# Patient Record
Sex: Male | Born: 1970 | Race: White | Hispanic: No | Marital: Married | Smoking: Former smoker
Health system: Southern US, Community
[De-identification: ages and names within clinical notes are randomized; demographics above are authoritative.]

---

## 2006-05-15 HISTORY — PX: OTHER SURGICAL HISTORY: SHX169

## 2012-08-14 ENCOUNTER — Ambulatory Visit (INDEPENDENT_AMBULATORY_CARE_PROVIDER_SITE_OTHER): Payer: BC Managed Care – PPO | Admitting: Adult Health

## 2012-08-14 ENCOUNTER — Encounter: Payer: Self-pay | Admitting: Adult Health

## 2012-08-14 VITALS — BP 131/75 | HR 75 | Temp 98.2°F | Resp 14 | Ht 73.0 in | Wt 175.0 lb

## 2012-08-14 DIAGNOSIS — Z125 Encounter for screening for malignant neoplasm of prostate: Secondary | ICD-10-CM | POA: Insufficient documentation

## 2012-08-14 DIAGNOSIS — Z1212 Encounter for screening for malignant neoplasm of rectum: Secondary | ICD-10-CM

## 2012-08-14 DIAGNOSIS — Z0000 Encounter for general adult medical examination without abnormal findings: Secondary | ICD-10-CM | POA: Insufficient documentation

## 2012-08-14 NOTE — Progress Notes (Signed)
Subjective:    Patient ID: Ronald [name], male    DOB: 08/09/1970, 42 y.o.   MRN: [id number]  HPI  Patient is a 42 y/o male who presents to clinic to establish care. He is in good health. He is originally from [place] and moved to [place] in 2009 with his wife.    Past Surgical History  Procedure Laterality Date  . Torn eye muscle Left 2008    Family Hx: Reviewed and noncontributory   History   Social History  . Marital Status: Married    Spouse Name: N/A    Number of Children: N/A  . Years of Education: 12   Occupational History  . Manager    Social History Main Topics  . Smoking status: Former Smoker -- 0.50 packs/day for 10 years    Types: Cigarettes    Quit date: 06/17/2011  . Smokeless tobacco: Never Used  . Alcohol Use: No  . Drug Use: No  . Sexually Active: Yes   Other Topics Concern  . Not on file   Social History Narrative  . No narrative on file     Health Maintenance:  Tdap - 2009  Flu shot - Does not get  Colonoscopy - N/A  Labs - Needs - cbc, lipids, cmet, stool cards sent with patient.  Depression Screen -   Tobacco Use - Remote hx. 10 year 1/2 ppd. Quit 2013  Dental Exams - Will start every 6 months.   Vision Exam - Every 2 years  Exercise - Elliptical, cardio 2 times per week. He works out at [employer] in [place]  Diet - [employer], gluten free mainly because his wife has celiac dz    Review of Systems  Constitutional: Negative.   HENT: Negative.   Eyes: Negative.   Respiratory: Negative.   Cardiovascular: Negative.   Gastrointestinal: Negative.   Endocrine: Negative.   Genitourinary: Negative.   Musculoskeletal: Negative.   Skin: Negative.   Allergic/Immunologic: Negative for food allergies.       Occasional seasonal allergies - mainly in the spring. Does not need any medication for this  Neurological: Negative.   Hematological: Negative.   Psychiatric/Behavioral: Negative.    BP 131/75  Pulse 75   Temp(Src) 98.2 F (36.8 C) (Oral)  Resp 14  Ht 6\' 1"  (1.854 m)  Wt 175 lb (79.379 kg)  BMI 23.09 kg/m2  SpO2 100%    Objective:   Physical Exam  Constitutional: He is oriented to person, place, and time. He appears well-developed and well-nourished. No distress.  HENT:  Head: Normocephalic and atraumatic.  Right Ear: External ear normal.  Left Ear: External ear normal.  Nose: Nose normal.  Mouth/Throat: Oropharynx is clear and moist.  Eyes: Conjunctivae are normal. Pupils are equal, round, and reactive to light. No scleral icterus.  Patient has hx of left eye muscle tear s/p surgical repair. His vision is altered with outer movements (sees double) with the left eye.  Neck: Normal range of motion. Neck supple. No tracheal deviation present. No thyromegaly present.  Cardiovascular: Normal rate, regular rhythm, normal heart sounds and intact distal pulses.  Exam reveals no gallop.   No murmur heard. Pulmonary/Chest: Effort normal and breath sounds normal. No respiratory distress. He has no wheezes. He has no rales.  Abdominal: Soft. Bowel sounds are normal. He exhibits no distension and no mass. There is no tenderness. There is no rebound and no guarding.  Musculoskeletal: Normal range of motion. He exhibits no edema and no  tenderness.  Lymphadenopathy:    He has no cervical adenopathy.  Neurological: He is alert and oriented to person, place, and time. He has normal strength and normal reflexes. He displays no atrophy and no tremor. No cranial nerve deficit or sensory deficit. He exhibits normal muscle tone. He displays a negative Romberg sign. Coordination and gait normal.  Skin: Skin is warm and dry. No rash noted. No erythema. No pallor.  Psychiatric: He has a normal mood and affect. His behavior is normal. Judgment and thought content normal.          Assessment & Plan:

## 2012-08-14 NOTE — Patient Instructions (Addendum)
   Thank you for choosing [place] [facility] for your health care needs.  Please return for your fasting labs.  The results will be available through MyChart for your convenience. Please remember to activate this. The activation code is located at the end of this form.

## 2012-08-14 NOTE — Assessment & Plan Note (Signed)
Normal physical exam. Labs ordered: cbc w/diff, cmet, lipid profile. Hemocult cards x 2 provided to patient. He will mail these cards back to the office. Patient will return for fasting labs.

## 2012-08-15 ENCOUNTER — Other Ambulatory Visit (INDEPENDENT_AMBULATORY_CARE_PROVIDER_SITE_OTHER): Payer: BC Managed Care – PPO

## 2012-08-15 DIAGNOSIS — Z0000 Encounter for general adult medical examination without abnormal findings: Secondary | ICD-10-CM

## 2012-08-15 LAB — CBC WITH DIFFERENTIAL/PLATELET
Basophils Absolute: 0.1 10*3/uL (ref 0.0–0.1)
Eosinophils Absolute: 0.3 10*3/uL (ref 0.0–0.7)
HCT: 44.6 % (ref 39.0–52.0)
Lymphs Abs: 1.5 10*3/uL (ref 0.7–4.0)
MCV: 87.8 fl (ref 78.0–100.0)
Monocytes Absolute: 0.5 10*3/uL (ref 0.1–1.0)
Platelets: 267 10*3/uL (ref 150.0–400.0)
RDW: 13 % (ref 11.5–14.6)

## 2012-08-15 LAB — COMPREHENSIVE METABOLIC PANEL
ALT: 17 U/L (ref 0–53)
Alkaline Phosphatase: 90 U/L (ref 39–117)
Sodium: 136 mEq/L (ref 135–145)
Total Bilirubin: 0.7 mg/dL (ref 0.3–1.2)
Total Protein: 7 g/dL (ref 6.0–8.3)

## 2012-08-15 LAB — LIPID PANEL
Cholesterol: 194 mg/dL (ref 0–200)
LDL Cholesterol: 123 mg/dL — ABNORMAL HIGH (ref 0–99)
VLDL: 12.6 mg/dL (ref 0.0–40.0)

## 2012-08-15 LAB — LDL CHOLESTEROL, DIRECT: Direct LDL: 131 mg/dL

## 2012-08-29 LAB — HEMOCCULT GUIAC POC 1CARD (OFFICE): Fecal Occult Blood, POC: NEGATIVE

## 2012-08-29 NOTE — Addendum Note (Signed)
Addended by: Baldomero Lamy on: 08/29/2012 01:35 PM   Modules accepted: Orders

## 2013-03-20 ENCOUNTER — Other Ambulatory Visit: Payer: Self-pay

## 2015-05-12 ENCOUNTER — Encounter: Payer: Self-pay | Admitting: Nurse Practitioner

## 2015-05-12 ENCOUNTER — Ambulatory Visit (INDEPENDENT_AMBULATORY_CARE_PROVIDER_SITE_OTHER): Payer: 59 | Admitting: Nurse Practitioner

## 2015-05-12 VITALS — BP 128/72 | HR 86 | Temp 98.0°F | Resp 14 | Ht 73.0 in | Wt 180.1 lb

## 2015-05-12 DIAGNOSIS — H6983 Other specified disorders of Eustachian tube, bilateral: Secondary | ICD-10-CM | POA: Diagnosis not present

## 2015-05-12 DIAGNOSIS — H6980 Other specified disorders of Eustachian tube, unspecified ear: Secondary | ICD-10-CM | POA: Insufficient documentation

## 2015-05-12 MED ORDER — PREDNISONE 10 MG PO TABS: ORAL_TABLET | ORAL | Status: DC

## 2015-05-12 NOTE — Patient Instructions (Signed)
Prednisone with breakfast or lunch at the latest.  6 tablets on day 1, 5 tablets on day 2, 4 tablets on day 3, 3 tablets on day 4, 2 tablets day 5, 1 tablet on day 6...done! Take tablets all together not spaced out Don't take with NSAIDs (Ibuprofen, Aleve, Naproxen, Meloxicam ect...)  Call us or MyChart us if not helpful after 6 days.

## 2015-05-12 NOTE — Progress Notes (Signed)
Pre visit review using our clinic review tool, if applicable. No additional management support is needed unless otherwise documented below in the visit note. 

## 2015-05-12 NOTE — Assessment & Plan Note (Signed)
Pt already had a z-pack and no symptoms of infection. Prednisone taper was sent to the pharmacy. FU prn worsening/failure to improve.

## 2015-05-12 NOTE — Progress Notes (Signed)
Patient ID: Ronald [name], male    DOB: Feb 13, 1971  Age: 44 y.o. MRN: [id number]  CC: Ear Pain and Cough   HPI Ronald [name] presents for CC of ear pain x 1.5 weeks.   1) Ear pain and fluid feeling 1.5 weeks  Was on z-pack recently for cough/bronchitis symptoms seen by Urgent care.  Warm water in ears because he felt they needed to be flushed out.  Feels his cough has improved, but is still there x 1 month. R> L ear pain  Treatment to date: Z-pack  Tylenol    History Ronald [name] has no past medical history on file.   He has past surgical history that includes Torn eye muscle (Left, 2008).   His Family history is unknown by patient.He reports that he quit smoking about 3 years ago. His smoking use included Cigarettes. He has a 5 pack-year smoking history. He has never used smokeless tobacco. He reports that he does not drink alcohol or use illicit drugs.  No outpatient prescriptions prior to visit.   No facility-administered medications prior to visit.    ROS Review of Systems  Constitutional: Negative for fever, chills, diaphoresis and fatigue.  HENT: Positive for congestion and ear pain. Negative for ear discharge, postnasal drip, rhinorrhea, sinus pressure, sneezing and sore throat.   Respiratory: Positive for cough. Negative for chest tightness, shortness of breath and wheezing.   Cardiovascular: Negative for chest pain, palpitations and leg swelling.  Gastrointestinal: Negative for nausea, vomiting and diarrhea.  Skin: Negative for rash.  Neurological: Negative for dizziness and headaches.  Psychiatric/Behavioral: The patient is not nervous/anxious.     Objective:  BP 128/72 mmHg  Pulse 86  Temp(Src) 98 F (36.7 C) (Oral)  Resp 14  Ht  (1.854 m)  Wt 180 lb 1.9 oz (81.702 kg)  BMI 23.77 kg/m2  SpO2 98%  Physical Exam  Constitutional: He is oriented to person, place, and time. He appears well-developed and well-nourished. No distress.   HENT:  Head: Normocephalic and atraumatic.  Right Ear: External ear normal.  Left Ear: External ear normal.  Mouth/Throat: No oropharyngeal exudate.  TM's serous fluid, landmarks visible, no injection retraction or bulging  Eyes: EOM are normal. Pupils are equal, round, and reactive to light. Right eye exhibits no discharge. Left eye exhibits no discharge. No scleral icterus.  Cardiovascular: Normal rate, regular rhythm and normal heart sounds.  Exam reveals no gallop and no friction rub.   No murmur heard. Pulmonary/Chest: Effort normal and breath sounds normal. No respiratory distress. He has no wheezes. He has no rales. He exhibits no tenderness.  Neurological: He is alert and oriented to person, place, and time.  Skin: Skin is warm and dry. No rash noted. He is not diaphoretic.  Psychiatric: He has a normal mood and affect. His behavior is normal. Judgment and thought content normal.   Assessment & Plan:   Vonnie was seen today for ear pain and cough.  Diagnoses and all orders for this visit:  ETD (eustachian tube dysfunction), bilateral  Other orders -     predniSONE (DELTASONE) 10 MG tablet; Take 6 tablets by mouth on day 1 with breakfast then decrease by 1 tablet each day until gone.   I am having [name] start on predniSONE.  Meds ordered this encounter  Medications  . predniSONE (DELTASONE) 10 MG tablet    Sig: Take 6 tablets by mouth on day 1 with breakfast then decrease by 1 tablet each day until gone.  Dispense:  21 tablet    Refill:  0    Order Specific Question:  Supervising Provider    Answer:  Sherlene ShamsULLO, TERESA L [[id number]]     Follow-up: Return if symptoms worsen or fail to improve.

## 2018-04-29 ENCOUNTER — Ambulatory Visit (INDEPENDENT_AMBULATORY_CARE_PROVIDER_SITE_OTHER): Payer: 59

## 2018-04-29 ENCOUNTER — Encounter: Payer: Self-pay | Admitting: Family Medicine

## 2018-04-29 ENCOUNTER — Ambulatory Visit: Payer: 59 | Admitting: Family Medicine

## 2018-04-29 VITALS — BP 120/82 | HR 84 | Temp 98.0°F | Ht 74.0 in | Wt 176.0 lb

## 2018-04-29 DIAGNOSIS — M25522 Pain in left elbow: Secondary | ICD-10-CM | POA: Diagnosis not present

## 2018-04-29 MED ORDER — MELOXICAM 7.5 MG PO TABS: 7.5000 mg | ORAL_TABLET | Freq: Every day | ORAL | 0 refills | Status: DC

## 2018-04-29 NOTE — Progress Notes (Signed)
Subjective:    Patient ID: Ronald [name], male    DOB: 1971/02/26, 47 y.o.   MRN: [id number]  HPI  Presents to clinic c/o left elbow pain for 3 weeks.  Last seen here By Naomie Dean NP in 05/12/2015.  Patient denies any known injury to left elbow.  States he does not regularly play any sort of sports, his job is not a physical job.  Denies any falls.  States he notices the pain mostly when he has to do grip something and lift.  Denies any numbness or tingling in extremity.  Denies any swelling of extremity or changes in skin color.  He has been taking 200 to 400 mg of ibuprofen for day without much help and pain relief and also wearing an elbow brace, states wearing the brace has been the most helpful to improve pain.  Patient Active Problem List   Diagnosis Date Noted  . ETD (eustachian tube dysfunction) 05/12/2015  . General medical exam 08/14/2012   Social History   Tobacco Use  . Smoking status: Former Smoker    Packs/day: 0.50    Years: 10.00    Pack years: 5.00    Types: Cigarettes    Last attempt to quit: 06/17/2011    Years since quitting: 6.8  . Smokeless tobacco: Never Used  Substance Use Topics  . Alcohol use: No   Review of Systems   Constitutional: Negative for chills, fatigue and fever.  HENT: Negative for congestion, ear pain, sinus pain and sore throat.   Eyes: Negative.   Respiratory: Negative for cough, shortness of breath and wheezing.   Cardiovascular: Negative for chest pain, palpitations and leg swelling.  Gastrointestinal: Negative for abdominal pain, diarrhea, nausea and vomiting.  Genitourinary: Negative for dysuria, frequency and urgency.  Musculoskeletal: +left elbow pain Skin: Negative for color change, pallor and rash.  Neurological: Negative for syncope, light-headedness and headaches.  Psychiatric/Behavioral: The patient is not nervous/anxious.       Objective:   Physical Exam Vitals signs and nursing note reviewed.    Constitutional:      General: He is not in acute distress.    Appearance: He is not toxic-appearing.  HENT:     Head: Normocephalic and atraumatic.  Eyes:     General: No scleral icterus.    Extraocular Movements: Extraocular movements intact.     Conjunctiva/sclera: Conjunctivae normal.  Cardiovascular:     Rate and Rhythm: Normal rate and regular rhythm.  Pulmonary:     Effort: Pulmonary effort is normal. No respiratory distress.     Breath sounds: Normal breath sounds. No wheezing or rhonchi.  Musculoskeletal:        General: No swelling.     Left elbow: He exhibits normal range of motion, no swelling, no effusion, no deformity and no laceration. Tenderness found.       Arms:     Comments: Patient has 2 areas of tenderness on left elbow indicated by red marks on diagram, it is above and below elbow joint.  Patient is able to fully bend and extend arm at elbow.  He is able to perform pronation and supination motion without issues, but does state the twisting motion does cause him to have some pain.  Grips equal and strong, states a gripping motion with left hand does cause pain in left elbow.  Range of motion of left shoulder and left wrist/fingers on left hand all intact.  Skin:    General: Skin is warm and dry.  Coloration: Skin is not jaundiced or pale.     Findings: No erythema or rash.  Neurological:     Mental Status: He is alert and oriented to person, place, and time.     Sensory: No sensory deficit.     Motor: No weakness.     Coordination: Coordination normal.     Gait: Gait normal.  Psychiatric:        Mood and Affect: Mood normal.        Behavior: Behavior normal.    Vitals:   04/29/18 1534  BP: 120/82  Pulse: 84  Temp: 98 F (36.7 C)  SpO2: 97%      Assessment & Plan:   Left elbow pain - unclear reason for her left elbow pain.  Exam of joint is unremarkable.  We will get x-ray to further evaluate and patient also will begin taking Mobic once per day.   Patient also advised to continue wearing the elbow brace as he has been, wearing the brace has been helpful to improve some pain.  Once we have x-ray results, we will better be able to determine next step in plan of care.  Options could be referral to sports medicine and/or orthopedics.  Patient advised to make a complete physical exam appointment as soon as he is available to get all of his health screening maintenances for his age up-to-date

## 2018-10-27 ENCOUNTER — Other Ambulatory Visit: Payer: Self-pay

## 2018-10-27 ENCOUNTER — Encounter (HOSPITAL_COMMUNITY): Payer: Self-pay | Admitting: Emergency Medicine

## 2018-10-27 ENCOUNTER — Emergency Department (HOSPITAL_COMMUNITY)
Admission: EM | Admit: 2018-10-27 | Discharge: 2018-10-27 | Disposition: A | Discharge status: Home / Self Care | Payer: 59 | Attending: Emergency Medicine | Admitting: Emergency Medicine

## 2018-10-27 DIAGNOSIS — R1013 Epigastric pain: Secondary | ICD-10-CM | POA: Diagnosis not present

## 2018-10-27 DIAGNOSIS — R030 Elevated blood-pressure reading, without diagnosis of hypertension: Secondary | ICD-10-CM | POA: Diagnosis not present

## 2018-10-27 DIAGNOSIS — Z79899 Other long term (current) drug therapy: Secondary | ICD-10-CM | POA: Diagnosis not present

## 2018-10-27 DIAGNOSIS — R42 Dizziness and giddiness: Secondary | ICD-10-CM | POA: Diagnosis present

## 2018-10-27 LAB — URINALYSIS, ROUTINE W REFLEX MICROSCOPIC
Bilirubin Urine: NEGATIVE
Glucose, UA: NEGATIVE mg/dL
Hgb urine dipstick: NEGATIVE
Ketones, ur: NEGATIVE mg/dL
Leukocytes,Ua: NEGATIVE
Nitrite: NEGATIVE
Protein, ur: NEGATIVE mg/dL
Specific Gravity, Urine: 1.004 — ABNORMAL LOW (ref 1.005–1.030)
pH: 6 (ref 5.0–8.0)

## 2018-10-27 LAB — COMPREHENSIVE METABOLIC PANEL
ALT: 13 U/L (ref 0–44)
AST: 12 U/L — ABNORMAL LOW (ref 15–41)
Albumin: 3.7 g/dL (ref 3.5–5.0)
Alkaline Phosphatase: 75 U/L (ref 38–126)
Anion gap: 7 (ref 5–15)
BUN: 14 mg/dL (ref 6–20)
CO2: 26 mmol/L (ref 22–32)
Calcium: 9 mg/dL (ref 8.9–10.3)
Chloride: 107 mmol/L (ref 98–111)
Creatinine, Ser: 1.03 mg/dL (ref 0.61–1.24)
GFR calc Af Amer: >60 mL/min (ref >60)
GFR calc non Af Amer: >60 mL/min (ref >60)
Glucose, Bld: 107 mg/dL — ABNORMAL HIGH (ref 70–99)
Potassium: 4.6 mmol/L (ref 3.5–5.1)
Sodium: 140 mmol/L (ref 135–145)
Total Bilirubin: 0.8 mg/dL (ref 0.3–1.2)
Total Protein: 6.1 g/dL — ABNORMAL LOW (ref 6.5–8.1)

## 2018-10-27 LAB — CBC
HCT: 41.4 % (ref 39.0–52.0)
Hemoglobin: 13.8 g/dL (ref 13.0–17.0)
MCH: 30.1 pg (ref 26.0–34.0)
MCHC: 33.3 g/dL (ref 30.0–36.0)
MCV: 90.4 fL (ref 80.0–100.0)
Platelets: 271 10*3/uL (ref 150–400)
RBC: 4.58 MIL/uL (ref 4.22–5.81)
RDW: 12.4 % (ref 11.5–15.5)
WBC: 5.6 10*3/uL (ref 4.0–10.5)
nRBC: 0 % (ref 0.0–0.2)

## 2018-10-27 LAB — LIPASE, BLOOD: Lipase: 91 U/L — ABNORMAL HIGH (ref 11–51)

## 2018-10-27 LAB — TROPONIN I: Troponin I: <0.03 ng/mL (ref <0.03)

## 2018-10-27 MED ORDER — SODIUM CHLORIDE 0.9% FLUSH: 3.0000 mL | Freq: Once | INTRAVENOUS | Status: DC

## 2018-10-27 MED ORDER — SUCRALFATE 1 G PO TABS: 1.0000 g | ORAL_TABLET | Freq: Three times a day (TID) | ORAL | 0 refills | Status: DC

## 2018-10-27 MED ORDER — PANTOPRAZOLE SODIUM 20 MG PO TBEC: 20.0000 mg | DELAYED_RELEASE_TABLET | Freq: Every day | ORAL | 0 refills | Status: DC

## 2018-10-27 NOTE — ED Triage Notes (Signed)
Pt reports epigastric pain x 2 weeks with nausea, dizziness improved with meclazine, slight headache.  Pt denies SOB, CP, cough, fevers, chills.

## 2018-10-27 NOTE — Discharge Instructions (Signed)
Please see the information and instructions below regarding your visit.  Your diagnoses today include:  1. Epigastric pain   2. Elevated blood pressure reading     Tests performed today include: See side panel of your discharge paperwork for testing performed today. Vital signs are listed at the bottom of these instructions.   Your work-up is very reassuring today.  Your lipase which is a pancreas number is slightly elevated today, which could indicate either prolonged period without eating or a bile duct stone that has since passed.  Medications prescribed:    Take any prescribed medications only as prescribed, and any over the counter medications only as directed on the packaging.  I recommend starting Protonix daily in the morning before breakfast.  You may also take Carafate which is medication that coats the lining of the stomach.  It can be taken 4 times a day, before each meal before bedtime.  Please start taking a medicine called Carafate or sucralfate.  This can be taken up to 4 times a day, with meals and before bedtime.  Please do not take your proton pump inhibitor (Protonix, Nexium, Prilosec) within 30 minutes of taking Carafate.  Please STOP medications such as ibuprofen/Advil/Motrin, naproxen/Aleve, meloxicam.  Home care instructions:  Please follow any educational materials contained in this packet.   Follow-up instructions: Please follow-up with [facility]. I placed a referral.   Return instructions:  Please return to the Emergency Department if you experience worsening symptoms.  Please return to the emergency department he develop any worsening pain, nausea or vomiting) keep anything down, chest pain, shortness of breath or new symptoms. Please return if you have any other emergent concerns.  Additional Information:   Your vital signs today were: BP (!) 137/91    Pulse 69    Temp 98.3 F (36.8 C) (Oral)    Resp 16    Ht 6\' 2"  (1.88 m)    Wt 78.5 kg    SpO2  100%    BMI 22.21 kg/m  If your blood pressure (BP) was elevated on multiple readings during this visit above 130 for the top number or above 80 for the bottom number, please have this repeated by your primary care provider within one month. --------------  Thank you for allowing [place] to participate in your care today.

## 2018-10-27 NOTE — ED Provider Notes (Signed)
[facility] Provider Note   CSN: [id number] Arrival date & time: 10/27/18  1138     History   Chief Complaint Chief Complaint  Patient presents with  . Abdominal Pain    HPI Evelena AsaChristopher Rieser is a 48 y.o. male.     HPI  Patient is a 48 year old male with no significant past medical history presenting for "dizziness" and epigastric pain.  Patient reports that his symptoms began approximately 2 to 3 weeks ago.  He reports that he will have intermittent episodes of feeling lightheaded that are not associated with any particular activity.  He will also experience episodic epigastric discomfort.  Denies radiation to his back or chest.  He denies any association with food or drink.  He denies any vomiting but does report intermittent nausea.  He denies any hematemesis, melena or change in coloration of his stool.  Denies constipation.  Denies history of abdominal surgeries.  Patient denies daily NSAID use but does report that he uses NSAIDs most weeks.  Denies daily alcohol use.  Denies other illicit drug use.  Patient denies history of peptic ulcer disease.  Patient reports that he does not know much of his medical history.  He denies any personal cardiovascular disease history and does not know anything about family history of cardiovascular disease.  He presented to urgent care last week and prescribed meclizine for the lightheadedness, but has not taken any other remedies.  History reviewed. No pertinent past medical history.  Patient Active Problem List   Diagnosis Date Noted  . ETD (eustachian tube dysfunction) 05/12/2015  . General medical exam 08/14/2012    Past Surgical History:  Procedure Laterality Date  . Torn eye muscle Left 2008        Home Medications    Prior to Admission medications   Medication Sig Start Date End Date Taking? Authorizing Provider  meloxicam (MOBIC) 7.5 MG tablet Take 1 tablet (7.5 mg total) by mouth  daily. 04/29/18   Tracey HarriesGuse, Lauren M, FNP    Family History Family History  Family history unknown: Yes    Social History Social History   Tobacco Use  . Smoking status: Former Smoker    Packs/day: 0.50    Years: 10.00    Pack years: 5.00    Types: Cigarettes    Quit date: 06/17/2011    Years since quitting: 7.3  . Smokeless tobacco: Never Used  Substance Use Topics  . Alcohol use: No  . Drug use: No     Allergies   Patient has no known allergies.   Review of Systems Review of Systems  Constitutional: Negative for chills and fever.  HENT: Negative for congestion, rhinorrhea, sinus pain and sore throat.   Eyes: Negative for visual disturbance.  Respiratory: Negative for cough, chest tightness and shortness of breath.   Cardiovascular: Negative for chest pain, palpitations and leg swelling.  Gastrointestinal: Positive for abdominal pain and nausea. Negative for constipation, diarrhea and vomiting.  Genitourinary: Negative for dysuria and flank pain.  Musculoskeletal: Negative for back pain and myalgias.  Skin: Negative for rash.  Neurological: Positive for light-headedness. Negative for dizziness, syncope and headaches.     Physical Exam Updated Vital Signs BP (!) 137/91   Pulse 85   Temp 98.3 F (36.8 C) (Oral)   Resp 15   Ht 6\' 2"  (1.88 m)   Wt 78.5 kg   SpO2 99%   BMI 22.21 kg/m   Physical Exam Vitals signs and nursing  note reviewed.  Constitutional:      General: He is not in acute distress.    Appearance: He is well-developed.  HENT:     Head: Normocephalic and atraumatic.  Eyes:     Conjunctiva/sclera: Conjunctivae normal.     Pupils: Pupils are equal, round, and reactive to light.  Neck:     Musculoskeletal: Normal range of motion and neck supple.  Cardiovascular:     Rate and Rhythm: Normal rate and regular rhythm.     Heart sounds: S1 normal and S2 normal. No murmur.  Pulmonary:     Effort: Pulmonary effort is normal.     Breath sounds:  Normal breath sounds. No wheezing or rales.  Abdominal:     General: There is no distension.     Palpations: Abdomen is soft.     Tenderness: There is no abdominal tenderness. There is no guarding.  Musculoskeletal: Normal range of motion.        General: No deformity.  Lymphadenopathy:     Cervical: No cervical adenopathy.  Skin:    General: Skin is warm and dry.     Findings: No erythema or rash.  Neurological:     Mental Status: He is alert.     Comments: Cranial nerves grossly intact. Patient moves extremities symmetrically and with good coordination.  Psychiatric:        Behavior: Behavior normal.        Thought Content: Thought content normal.        Judgment: Judgment normal.      ED Treatments / Results  Labs (all labs ordered are listed, but only abnormal results are displayed) Labs Reviewed  LIPASE, BLOOD - Abnormal; Notable for the following components:      Result Value   Lipase 91 (*)    All other components within normal limits  COMPREHENSIVE METABOLIC PANEL - Abnormal; Notable for the following components:   Glucose, Bld 107 (*)    Total Protein 6.1 (*)    AST 12 (*)    All other components within normal limits  URINALYSIS, ROUTINE W REFLEX MICROSCOPIC - Abnormal; Notable for the following components:   Color, Urine COLORLESS (*)    Specific Gravity, Urine 1.004 (*)    All other components within normal limits  CBC  TROPONIN I    EKG    EKG Interpretation  Date/Time:  Sunday October 27 2018 12:18:32 EDT Ventricular Rate:  73 PR Interval:    QRS Duration: 92 QT Interval:  387 QTC Calculation: 427 R Axis:   54 Text Interpretation:  dSinus rhythm Borderline short PR interval No old tracing to compare Confirmed by Jola Schmidt [phone]) on 10/27/2018 2:01:44 PM       Radiology No results found.  Procedures Procedures (including critical care time)  Medications Ordered in ED Medications  sodium chloride flush (NS) 0.9 % injection 3 mL (has no  administration in time range)     Initial Impression / Assessment and Plan / ED Course  I have reviewed the triage vital signs and the nursing notes.  Pertinent labs & imaging results that were available during my care of the patient were reviewed by me and considered in my medical decision making (see chart for details).  Clinical Course as of Oct 26 1401  Sun Oct 27, 2018  1319 Normal hemoglobin.   Hemoglobin: 13.8 [AM]  1333 Lipase(!): 91 [AM]  1333 Troponin I: <0.03 [AM]    Clinical Course User Index [AM] Langston Masker  B, PA-C       This is a well-appearing 48 year old male with no significant past medical history presenting for epigastric pain and intermittent lightheadedness.  Differential diagnosis includes peptic ulcer disease, anemia, ACS, gastritis, esophageal spasm, pancretitis.  He is pain-free here and has a benign abdominal exam.  Patient does not have any classic exertional symptoms suggesting unstable angina.  Work-up is reassuring.  No leukocytosis.  Total protein slightly low, but CMP otherwise unremarkable.  Troponin is negative.  Lipase slightly elevated at 91, but does not meet diagnostic criteria for pancreatitis.  Urinalysis is clear without evidence of infection.  EKG normal sinus rhythm with evidence of acute ischemia, infarction, or arrhythmia, reviewed by me.  Given patient's well appearance, and pain-free status, feel that patient's presentation most representive of gastritis versus peptic ulcer disease.  Will start patient on proton pump inhibitor and Carafate.  [facility] is a [facility] and will refer to [facility].   Final Clinical Impressions(s) / ED Diagnoses   Final diagnoses:  Epigastric pain  Elevated blood pressure reading    ED Discharge Orders         Ordered    Ambulatory referral to Gastroenterology     10/27/18 1407    pantoprazole (PROTONIX) 20 MG tablet  Daily     10/27/18 1409    sucralfate (CARAFATE) 1 g tablet  3 times daily  with meals & bedtime     10/27/18 1409           Delia ChimesMurray, Alyssa B, PA-C 10/27/18 2332    Azalia Bilisampos, Kevin, MD 10/28/18 1348

## 2018-10-28 ENCOUNTER — Encounter: Payer: Self-pay | Admitting: Gastroenterology

## 2018-10-29 ENCOUNTER — Emergency Department (HOSPITAL_COMMUNITY)
Admission: EM | Admit: 2018-10-29 | Discharge: 2018-10-29 | Disposition: A | Discharge status: Home / Self Care | Payer: 59 | Attending: Emergency Medicine | Admitting: Emergency Medicine

## 2018-10-29 ENCOUNTER — Encounter (HOSPITAL_COMMUNITY): Payer: Self-pay | Admitting: Emergency Medicine

## 2018-10-29 ENCOUNTER — Emergency Department (HOSPITAL_COMMUNITY): Payer: 59

## 2018-10-29 ENCOUNTER — Other Ambulatory Visit: Payer: Self-pay

## 2018-10-29 DIAGNOSIS — R911 Solitary pulmonary nodule: Secondary | ICD-10-CM

## 2018-10-29 DIAGNOSIS — R42 Dizziness and giddiness: Secondary | ICD-10-CM | POA: Diagnosis not present

## 2018-10-29 DIAGNOSIS — R5383 Other fatigue: Secondary | ICD-10-CM

## 2018-10-29 LAB — CBC
HCT: 44.1 % (ref 39.0–52.0)
Hemoglobin: 14.5 g/dL (ref 13.0–17.0)
MCH: 30 pg (ref 26.0–34.0)
MCHC: 32.9 g/dL (ref 30.0–36.0)
MCV: 91.1 fL (ref 80.0–100.0)
Platelets: 299 10*3/uL (ref 150–400)
RBC: 4.84 MIL/uL (ref 4.22–5.81)
RDW: 12.2 % (ref 11.5–15.5)
WBC: 10.3 10*3/uL (ref 4.0–10.5)
nRBC: 0 % (ref 0.0–0.2)

## 2018-10-29 LAB — COMPREHENSIVE METABOLIC PANEL
ALT: 14 U/L (ref 0–44)
AST: 14 U/L — ABNORMAL LOW (ref 15–41)
Albumin: 4.1 g/dL (ref 3.5–5.0)
Alkaline Phosphatase: 75 U/L (ref 38–126)
Anion gap: 10 (ref 5–15)
BUN: 14 mg/dL (ref 6–20)
CO2: 25 mmol/L (ref 22–32)
Calcium: 9.3 mg/dL (ref 8.9–10.3)
Chloride: 105 mmol/L (ref 98–111)
Creatinine, Ser: 1.06 mg/dL (ref 0.61–1.24)
GFR calc Af Amer: >60 mL/min (ref >60)
GFR calc non Af Amer: >60 mL/min (ref >60)
Glucose, Bld: 97 mg/dL (ref 70–99)
Potassium: 4.7 mmol/L (ref 3.5–5.1)
Sodium: 140 mmol/L (ref 135–145)
Total Bilirubin: 0.6 mg/dL (ref 0.3–1.2)
Total Protein: 6.5 g/dL (ref 6.5–8.1)

## 2018-10-29 LAB — TSH: TSH: 2.948 u[IU]/mL (ref 0.350–4.500)

## 2018-10-29 LAB — T4, FREE: Free T4: 0.85 ng/dL (ref 0.61–1.12)

## 2018-10-29 LAB — TROPONIN I: Troponin I: <0.03 ng/mL (ref <0.03)

## 2018-10-29 LAB — LIPASE, BLOOD: Lipase: 38 U/L (ref 11–51)

## 2018-10-29 NOTE — ED Provider Notes (Signed)
[name]   CSN: [id number] Arrival date & time: 10/29/18  1402    History   Chief Complaint Chief Complaint  Patient presents with  . Fatigue  . Dizziness    HPI Ronald [name] is a 48 y.o. male.     HPI  Patient presents to the emergency room for evaluation of persistent fatigue and general malaise.  Patient started having symptoms a few weeks ago.  He has been having intermittent episodes of feeling off.  He describes it as a lightheadedness.  He room spinning sensation.  He is not have any trouble with his gait.  He has not had any syncopal episodes.  He does not have any trouble with his speech balance or coordination.  He is also had some intermittent discomfort in his epigastric region.  It will come and go.  He denies any vomiting or diarrhea.  No fevers or chills.  No abdominal pain.  No chest pain.  No headache.  Patient was seen in the emergency room on June 14.  He had a reassuring evaluation and was referred to gastroenterology.  Patient states he had another so today.  He just wanted to make sure he was okay to wait until Thursday so he came in for reevaluation. History reviewed. No pertinent past medical history.  Patient Active Problem List   Diagnosis Date Noted  . ETD (eustachian tube dysfunction) 05/12/2015  . General medical exam 08/14/2012    Past Surgical History:  Procedure Laterality Date  . Torn eye muscle Left 2008        Home Medications    Prior to Admission medications   Medication Sig Start Date End Date Taking? Authorizing Provider  aspirin EC 325 MG tablet Take 650 mg by mouth daily as needed (headache).   Yes [provider]  Omega-3 1000 MG CAPS Take 1,000 mg by mouth daily.   Yes [provider]  pantoprazole (PROTONIX) 20 MG tablet Take 1 tablet (20 mg total) by mouth daily for 30 days. 10/27/18 11/26/18 Yes Dayton ScrapeMurray, Alyssa B, PA-C  sucralfate (CARAFATE) 1 g  tablet Take 1 tablet (1 g total) by mouth 4 (four) times daily -  with meals and at bedtime for 10 days. 10/27/18 11/06/18 Yes Elisha PonderMurray, Alyssa B, PA-C    Family History Family History  Family history unknown: Yes    Social History Social History   Tobacco Use  . Smoking status: Former Smoker    Packs/day: 0.50    Years: 10.00    Pack years: 5.00    Types: Cigarettes    Quit date: 06/17/2011    Years since quitting: 7.3  . Smokeless tobacco: Never Used  Substance Use Topics  . Alcohol use: No  . Drug use: No     Allergies   Patient has no known allergies.   Review of Systems Review of Systems  All other systems reviewed and are negative.    Physical Exam Updated Vital Signs BP (!) 135/96   Pulse 62   Temp 98.8 F (37.1 C) (Oral)   Resp 13   SpO2 100%   Physical Exam Vitals signs and nursing note reviewed.  Constitutional:      General: He is not in acute distress.    Appearance: He is well-developed.  HENT:     Head: Normocephalic and atraumatic.     Right Ear: External ear normal.     Left Ear: External ear normal.  Eyes:  General: No scleral icterus.       Right eye: No discharge.        Left eye: No discharge.     Conjunctiva/sclera: Conjunctivae normal.  Neck:     Musculoskeletal: Neck supple.     Trachea: No tracheal deviation.  Cardiovascular:     Rate and Rhythm: Normal rate and regular rhythm.  Pulmonary:     Effort: Pulmonary effort is normal. No respiratory distress.     Breath sounds: Normal breath sounds. No stridor. No wheezing or rales.  Abdominal:     General: Bowel sounds are normal. There is no distension.     Palpations: Abdomen is soft.     Tenderness: There is no abdominal tenderness. There is no guarding or rebound.  Musculoskeletal:        General: No tenderness.  Skin:    General: Skin is warm and dry.     Findings: No rash.  Neurological:     Mental Status: He is alert.     Cranial Nerves: No cranial nerve deficit (no  facial droop, extraocular movements intact, no slurred speech).     Sensory: No sensory deficit.     Motor: No abnormal muscle tone or seizure activity.     Coordination: Coordination normal.      ED Treatments / Results  Labs (all labs ordered are listed, but only abnormal results are displayed) Labs Reviewed  COMPREHENSIVE METABOLIC PANEL - Abnormal; Notable for the following components:      Result Value   AST 14 (*)    All other components within normal limits  CBC  TROPONIN I  TSH  T4, FREE  LIPASE, BLOOD    EKG EKG Interpretation  Date/Time:  Tuesday October 29 2018 15:59:39 EDT Ventricular Rate:  67 PR Interval:    QRS Duration: 94 QT Interval:  421 QTC Calculation: 445 R Axis:   53 Text Interpretation:  Sinus rhythm Abnormal R-wave progression, early transition No significant change since last tracing Confirmed by Linwood DibblesKnapp, Addisen Chappelle [phone]([id number]) on 10/29/2018 4:07:12 PM   Radiology Dg Chest 2 View  Result Date: 10/29/2018 CLINICAL DATA:  Fatigue and lightheaded for 2 days. EXAM: CHEST - 2 VIEW COMPARISON:  None. FINDINGS: The cardiac silhouette, mediastinal and hilar contours are within normal limits. No infiltrates, edema or effusions. There is a rounded nodular density in the right upper lobe which is an indeterminate finding without prior chest films. Chest CT recommended to exclude a pulmonary nodule given smoking history. The bony thorax is intact. IMPRESSION: 1. No acute cardiopulmonary findings. 2. Suspect right upper lobe pulmonary nodule. Chest CT suggested for further evaluation. Electronically Signed   By: Rudie MeyerP.  Gallerani M.D.   On: 10/29/2018 16:35    Procedures Procedures (including critical care time)  Medications Ordered in ED Medications - No data to display   Initial Impression / Assessment and Plan / ED Course  I have reviewed the triage vital signs and the nursing notes.  Pertinent labs & imaging results that were available during my care of the patient  were reviewed by me and considered in my medical decision making (see chart for details).   Patient's ED work-up is reassuring.  He is not anemic.  No signs of acute cardiac injury.  No evidence of thyroid dysfunction.  He has normal electrolytes and renal function.  The etiology of his symptoms is unclear but I am not see any signs of any emergency medical condition.  Incidental lung nodule was discussed with  the patient.  I recommended he also keep a record of his blood pressure over the next week or 2 and bring that to review with his primary care doctor.  Final Clinical Impressions(s) / ED Diagnoses   Final diagnoses:  Other fatigue  Lung nodule    ED Discharge Orders    None       Dorie Rank, MD 10/29/18 1727

## 2018-10-29 NOTE — ED Triage Notes (Signed)
Pt. Stated, I was here on Sunday with the same problems. Im tired, I feel light headed. I feel like there is a ball stuck in my lower throat but nothing is there. I have an appt with [facility] on Thursday but did not want to wait.

## 2018-10-29 NOTE — Discharge Instructions (Signed)
Follow-up with your appointment as scheduled.  Start keeping a record of your blood pressure.  Take it at least once or twice daily for the next week or 2 to show to your primary care doctor.  The radiologist did recommend a chest CT at some point to follow-up on an incidental lung nodule.

## 2018-10-31 ENCOUNTER — Ambulatory Visit (INDEPENDENT_AMBULATORY_CARE_PROVIDER_SITE_OTHER): Payer: 59 | Admitting: Family Medicine

## 2018-10-31 ENCOUNTER — Encounter: Payer: Self-pay | Admitting: Family Medicine

## 2018-10-31 ENCOUNTER — Other Ambulatory Visit: Payer: Self-pay

## 2018-10-31 VITALS — BP 112/68 | HR 64 | Temp 97.8°F | Resp 18 | Ht 74.0 in | Wt 175.0 lb

## 2018-10-31 DIAGNOSIS — R5383 Other fatigue: Secondary | ICD-10-CM | POA: Diagnosis not present

## 2018-10-31 DIAGNOSIS — R1013 Epigastric pain: Secondary | ICD-10-CM | POA: Diagnosis not present

## 2018-10-31 DIAGNOSIS — Z0000 Encounter for general adult medical examination without abnormal findings: Secondary | ICD-10-CM

## 2018-10-31 DIAGNOSIS — R079 Chest pain, unspecified: Secondary | ICD-10-CM

## 2018-10-31 DIAGNOSIS — F418 Other specified anxiety disorders: Secondary | ICD-10-CM | POA: Diagnosis not present

## 2018-10-31 MED ORDER — HYDROXYZINE HCL 25 MG PO TABS: 25.0000 mg | ORAL_TABLET | Freq: Three times a day (TID) | ORAL | 2 refills | Status: DC | PRN

## 2018-10-31 NOTE — Progress Notes (Signed)
Subjective:    Patient ID: Ronald [name], male    DOB: 08/26/1970, 48 y.o.   MRN: [id number]  HPI   Patient presents to clinic for complete physical exam and ER follow-up.  Patient went to the ER on 6/14 and 10/29/2018 due to upper abdominal pain, lower chest pain and feeling of fatigue.  Lab work, EKGs and chest x-ray performed which ruled out acute coronary syndrome and did not show any obvious signs of fatigue.  Patient diagnosed with a suspected ulcer/GERD and was prescribed Protonix daily, Carafate 4 times daily and has been set up with GI.  States GI appointment is happening on 6/29 and he was told he most likely will get an endoscopy.  Patient is still concerned because he has been taking the Protonix and Carafate now for 2 days, but still has upper abdominal pain and lower chest pain.  Patient is concerned something else could be going on with his heart they did not see.  Prior to going to the emergency room recently, he took no regular medications and would describe himself as a healthy 48 year old male.  Denies feeling down or depressed.  Is currently anxious about his current health situation and worried something could be wrong however prior to going to the emergency room would not describe himself as anxious.  Denies SI or HI.  He tries to follow healthy diet and exercise regularly.  He does see the dentist twice per year and usually sees eye doctor every 2 years.  States he does not know much about his family history but as far as he knows there is no history of any cancers in the family.   Patient Active Problem List   Diagnosis Date Noted  . ETD (eustachian tube dysfunction) 05/12/2015  . General medical exam 08/14/2012   Social History   Tobacco Use  . Smoking status: Former Smoker    Packs/day: 0.50    Years: 10.00    Pack years: 5.00    Types: Cigarettes    Quit date: 06/17/2011    Years since quitting: 7.3  . Smokeless tobacco: Never Used  Substance Use  Topics  . Alcohol use: No   Past Surgical History:  Procedure Laterality Date  . Torn eye muscle Left 2008   Family History  Family history unknown: Yes    Review of Systems  Constitutional: Negative for chills, fatigue and fever.  HENT: Negative for congestion, ear pain, sinus pain and sore throat.   Eyes: Negative.   Respiratory: Negative for cough, shortness of breath and wheezing.   Cardiovascular: +lower chest pain. Negative for palpitations and leg swelling.  Gastrointestinal: +upper ABD pain. Negative for diarrhea, nausea and vomiting.  Genitourinary: Negative for dysuria, frequency and urgency.  Musculoskeletal: Negative for arthralgias and myalgias.  Skin: Negative for color change, pallor and rash.  Neurological: Negative for syncope, light-headedness and headaches.  Psychiatric/Behavioral: The patient is nervous/anxious.       Objective:   Physical Exam Vitals signs and nursing note reviewed.  Constitutional:      General: He is not in acute distress.    Appearance: He is normal weight. He is not ill-appearing, toxic-appearing or diaphoretic.  HENT:     Head: Normocephalic and atraumatic.     Right Ear: Tympanic membrane, ear canal and external ear normal.     Left Ear: Tympanic membrane, ear canal and external ear normal.  Eyes:     General: No scleral icterus.    Extraocular Movements: Extraocular  movements intact.     Conjunctiva/sclera: Conjunctivae normal.     Pupils: Pupils are equal, round, and reactive to light.  Cardiovascular:     Rate and Rhythm: Normal rate and regular rhythm.     Heart sounds: Normal heart sounds. No murmur. No gallop.   Pulmonary:     Effort: Pulmonary effort is normal. No respiratory distress.     Breath sounds: Normal breath sounds. No stridor. No wheezing, rhonchi or rales.  Chest:     Chest wall: Tenderness present.       Comments: Area of chest tenderness indicated by red circle on diagram Abdominal:     General:  Abdomen is flat. Bowel sounds are normal. There is no distension.     Palpations: Abdomen is soft. There is no mass.     Tenderness: There is abdominal tenderness (epigastric tenderness). There is no right CVA tenderness, left CVA tenderness, guarding or rebound.     Hernia: No hernia is present.       Comments: Location of ABD tenderness indicated by blue on diagram  Neurological:     Mental Status: He is alert.    BP Readings from Last 3 Encounters:  10/29/18 (!) 135/96  10/27/18 125/84  04/29/18 120/82   Today's Vitals   10/31/18 1503  BP: 112/68  Pulse: 64  Resp: 18  Temp: 97.8 F (36.6 C)  TempSrc: Oral  SpO2: 98%  Weight: 175 lb (79.4 kg)  Height: 6\' 2"  (1.88 m)   Body mass index is 22.47 kg/m.     Assessment & Plan:   Well adult exam - overall patient appears a healthy 48 year old male.  Lab work done in the emergency department was reviewed by me and shows stable complete blood count, CMP, thyroid level.  He declines getting any more lab work today to check lipid panel or PSA.  Healthy diet and regular fixable activity reviewed and recommended.  Patient always wears seatbelt while in vehicle.  Sees dentist and eye doctor regularly.  Discussed safe sun practices including wearing SPF of at least 30 when outdoors for extended period of time, also recommended wearing a wide brim hat and/or long sleeves of going to be in direct sun for many hours.  Epigastric pain/chest pain- reassured patient that his work-up in ER is unremarkable for any acute cardiac issue.  Also reassured that his physical exam does appear consistent with ulcer/severe acid reflux.  Recommended he continue taking Protonix and Carafate as prescribed and follow-up with GI.  No abdominal imaging was done in the emergency room, so patient is interested in having abdominal ultrasound.  Patient also concerned that there still could be something going on with a heart they did not see on the EKG, and the troponins  or while he was hooked to the heart monitor.  After discussion of options, patient is agreeable to get a echocardiogram.  Fatigue - reviewed lab work with patient and advised that nothing in the labs done in the emergency room reveal any obvious cause of fatigue.  Offered to check more lab work including vitamin D, B12 and iron levels to further investigate fatigue however he declines and will let me know if he changes his mind.  Patient aware someone will contact him to set up echocardiogram and abdominal ultrasound appointment.  He will keep follow-up with GI.  Advised to call office right away if any symptoms change or new symptoms arise.

## 2018-10-31 NOTE — Patient Instructions (Addendum)
BP Readings from Last 3 Encounters:  10/29/18 (!) 135/96  10/27/18 125/84  04/29/18 120/82    Peptic Ulcer  A peptic ulcer is a painful sore in the lining of your stomach or the first part of your small intestine. What are the causes? Common causes of this condition include:  An infection.  Using certain pain medicines too often or too much. What increases the risk? You are more likely to get this condition if you:  Smoke.  Have a family history of ulcer disease.  Drink alcohol.  Have been hospitalized in an intensive care unit (ICU). What are the signs or symptoms? Symptoms include:  Burning pain in the area between the chest and the belly button. The pain may: ? Not go away (be persistent). ? Be worse when your stomach is empty. ? Be worse at night.  Heartburn.  Feeling sick to your stomach (nauseous) and throwing up (vomiting).  Bloating. If the ulcer results in bleeding, it can cause you to:  Have poop (stool) that is black and looks like tar.  Throw up bright red blood.  Throw up material that looks like coffee grounds. How is this treated? Treatment for this condition may include:  Stopping things that can cause the ulcer, such as: ? Smoking. ? Using pain medicines.  Medicines to reduce stomach acid.  Antibiotic medicines if the ulcer is caused by an infection.  A procedure that is done using a small, flexible tube that has a camera at the end (upper endoscopy). This may be done if you have a bleeding ulcer.  Surgery. This may be needed if: ? You have a lot of bleeding. ? The ulcer caused a hole somewhere in the digestive system. Follow these instructions at home:  Do not drink alcohol if your doctor tells you not to drink.  Limit how much caffeine you take in.  Do not use any products that contain nicotine or tobacco, such as cigarettes, e-cigarettes, and chewing tobacco. If you need help quitting, ask your doctor.  Take over-the-counter and  prescription medicines only as told by your doctor. ? Do not stop or change your medicines unless you talk with your doctor about it first. ? Do not take aspirin, ibuprofen, or other NSAIDs unless your doctor told you to do so.  Keep all follow-up visits as told by your doctor. This is important. Contact a doctor if:  You do not get better in 7 days after you start treatment.  You keep having an upset stomach (indigestion) or heartburn. Get help right away if:  You have sudden, sharp pain in your belly (abdomen).  You have belly pain that does not go away.  You have bloody poop (stool) or black, tarry poop.  You throw up blood. It may look like coffee grounds.  You feel light-headed or feel like you may pass out (faint).  You get weak.  You get sweaty or feel sticky and cold to the touch (clammy). Summary  Symptoms of a peptic ulcer include burning pain in the area between the chest and the belly button.  Take medicines only as told by your doctor.  Limit how much alcohol and caffeine you have.  Keep all follow-up visits as told by your doctor. This information is not intended to replace advice given to you by your health care provider. Make sure you discuss any questions you have with your health care provider. Document Released: 07/26/2009 Document Revised: 11/06/2017 Document Reviewed: 11/06/2017 Elsevier Interactive Patient Education  2019 [place].

## 2018-11-08 ENCOUNTER — Ambulatory Visit
Admission: RE | Admit: 2018-11-08 | Discharge: 2018-11-08 | Disposition: A | Discharge status: Home / Self Care | Payer: 59 | Source: Ambulatory Visit | Attending: Family Medicine | Admitting: Family Medicine

## 2018-11-08 ENCOUNTER — Other Ambulatory Visit: Payer: Self-pay

## 2018-11-08 DIAGNOSIS — R1013 Epigastric pain: Secondary | ICD-10-CM | POA: Diagnosis not present

## 2018-11-11 ENCOUNTER — Ambulatory Visit (INDEPENDENT_AMBULATORY_CARE_PROVIDER_SITE_OTHER): Payer: 59 | Admitting: Gastroenterology

## 2018-11-11 ENCOUNTER — Encounter: Payer: Self-pay | Admitting: Gastroenterology

## 2018-11-11 VITALS — Ht 74.0 in | Wt 175.0 lb

## 2018-11-11 DIAGNOSIS — R911 Solitary pulmonary nodule: Secondary | ICD-10-CM | POA: Diagnosis not present

## 2018-11-11 DIAGNOSIS — R1013 Epigastric pain: Secondary | ICD-10-CM

## 2018-11-11 NOTE — Patient Instructions (Addendum)
I recommended and offered a CT scan of his chest to follow-up the possible pulmonary nodule however he declined.  I told him to call back if he changes his mind or to call his primary care physician about it.   We will arrange upper endoscopy at his soonest convenience for epigastric pains.  Thank you for entrusting me with your care and choosing [facility].  Dr Ardis Hughs

## 2018-11-11 NOTE — Progress Notes (Signed)
This service was provided via virtual visit.  Both audio and visual were used. The patient was located at home.  I was located in my office.  The patient did consent to this virtual visit and is aware of possible charges through their insurance for this visit.  The patient is a new patient.  My certified medical assistant, Grace Bushy, contributed to this visit by contacting the patient by phone 1 or 2 business days prior to the appointment and also followed up on the recommendations I made after the visit.  Time spent on virtual visit:25 minutes, referred by Philis Nettle, NP   HPI: This is a very pleasant 48 year old man whom I met for the first time via telemedicine visit today.  About a month ago he started having epigastric pains.  He describes it as a heaviness.  Pushing at the site of the pain in his epigastrium can sometimes relieve the pain.  Pain is intermittent.  Eating does not reliably bring it on.  Exercise does not cause it at all.  When he feels that the pain will last 2 to 3 hours.  He often feels nausea at the same time.  Pain does not radiate anywhere.  He went to the emergency room for this and underwent lab test x-rays.  See those below.  He was given Protonix which he is taking and he thinks it does help the pain somewhat but not completely.  He saw his primary care physician in follow-up after his ER visit and was scheduled to have an echocardiogram in early July for this.  He has had no bowel troubles.  Specifically no constipation, no diarrhea, no overt bleeding.  Labs June 2020 show normal complete metabolic profile, normal TSH, normal CBC, normal lipase  Abdominal ultrasound June 2020 indication "epigastric pain" findings sludge in the gallbladder.  No obvious stones.  Bile duct was normal.  Examination was otherwise normal.  2 view chest x-ray June 2020 "fatigue and lightheaded".  "Suspect right upper lobe pulmonary nodule."  The radiologist recommended a CT scan be done in  follow-up however I do not see that that has been ordered.  He takes ibuprofen for headaches.  Takes this twice per week 2 pills at a time.  Takes tylenol.     Chief complaint is epigastric pain, pulmonary nodule  ROS: complete GI ROS as described in HPI, all other review negative.  Constitutional:  No unintentional weight loss   History reviewed. No pertinent past medical history.  Past Surgical History:  Procedure Laterality Date  . Torn eye muscle Left 2008    Current Outpatient Medications  Medication Sig Dispense Refill  . aspirin EC 325 MG tablet Take 650 mg by mouth daily as needed (headache).    . hydrOXYzine (ATARAX/VISTARIL) 25 MG tablet Take 1 tablet (25 mg total) by mouth 3 (three) times daily as needed. 30 tablet 2  . Omega-3 1000 MG CAPS Take 1,000 mg by mouth daily.    . pantoprazole (PROTONIX) 20 MG tablet Take 1 tablet (20 mg total) by mouth daily for 30 days. 30 tablet 0  . sucralfate (CARAFATE) 1 g tablet Take 1 tablet (1 g total) by mouth 4 (four) times daily -  with meals and at bedtime for 10 days. 40 tablet 0   No current facility-administered medications for this visit.     Allergies as of 11/11/2018  . (No Known Allergies)    Family History  Family history unknown: Yes    Social  History   Socioeconomic History  . Marital status: Married    Spouse name: Not on file  . Number of children: Not on file  . Years of education: 81  . Highest education level: Not on file  Occupational History  . Occupation: Freight forwarder  Social Needs  . Financial resource strain: Not on file  . Food insecurity    Worry: Not on file    Inability: Not on file  . Transportation needs    Medical: Not on file    Non-medical: Not on file  Tobacco Use  . Smoking status: Former Smoker    Packs/day: 0.50    Years: 10.00    Pack years: 5.00    Types: Cigarettes    Quit date: 06/17/2011    Years since quitting: 7.4  . Smokeless tobacco: Never Used  Substance and Sexual  Activity  . Alcohol use: No  . Drug use: No  . Sexual activity: Yes  Lifestyle  . Physical activity    Days per week: Not on file    Minutes per session: Not on file  . Stress: Not on file  Relationships  . Social Herbalist on phone: Not on file    Gets together: Not on file    Attends religious service: Not on file    Active member of club or organization: Not on file    Attends meetings of clubs or organizations: Not on file    Relationship status: Not on file  . Intimate partner violence    Fear of current or ex partner: Not on file    Emotionally abused: Not on file    Physically abused: Not on file    Forced sexual activity: Not on file  Other Topics Concern  . Not on file  Social History Narrative  . Not on file     Physical Exam: Unable to perform because this was a "telemed visit" due to current Covid-19 pandemic  Assessment and plan: 48 y.o. male with epigastric pain, pulmonary nodule  First I recommended and offered to arrange a CT scan of his chest to work-up the pulmonary nodule that was described on his recent chest x-ray.  He declined.  I told him if he changes his mind he can call any time or call his primary care physician and I recommended not overlooking something that could potentially be a growth or cancer.  Second for his epigastric pain this may be biliary, might be gastric.  Abdominal ultrasound suggested possibly some sludge.  Maybe he has biliary dyskinesia.  He does take NSAIDs periodically and I recommended EGD as first test to work this up.  I see no reason for any further blood tests or imaging studies prior to then.  Please see the "Patient Instructions" section for addition details about the plan.  Owens Loffler, MD [facility] 11/11/2018, 3:39 PM

## 2018-11-18 ENCOUNTER — Other Ambulatory Visit: Payer: Self-pay

## 2018-11-18 ENCOUNTER — Ambulatory Visit
Admission: RE | Admit: 2018-11-18 | Discharge: 2018-11-18 | Disposition: A | Discharge status: Home / Self Care | Payer: 59 | Source: Ambulatory Visit | Attending: Family Medicine | Admitting: Family Medicine

## 2018-11-18 DIAGNOSIS — R1013 Epigastric pain: Secondary | ICD-10-CM | POA: Diagnosis not present

## 2018-11-18 DIAGNOSIS — R079 Chest pain, unspecified: Secondary | ICD-10-CM | POA: Insufficient documentation

## 2018-11-18 NOTE — Progress Notes (Signed)
*  PRELIMINARY RESULTS* Echocardiogram 2D Echocardiogram has been performed.  Ronald [name] 11/18/2018, 10:17 AM

## 2018-11-25 ENCOUNTER — Telehealth: Payer: Self-pay | Admitting: Gastroenterology

## 2018-11-25 NOTE — Telephone Encounter (Signed)

## 2018-11-26 ENCOUNTER — Encounter: Payer: Self-pay | Admitting: Gastroenterology

## 2018-11-26 ENCOUNTER — Ambulatory Visit (AMBULATORY_SURGERY_CENTER): Payer: 59 | Admitting: Gastroenterology

## 2018-11-26 ENCOUNTER — Other Ambulatory Visit: Payer: Self-pay

## 2018-11-26 VITALS — BP 113/75 | HR 57 | Temp 98.3°F | Resp 16 | Ht 74.0 in | Wt 175.0 lb

## 2018-11-26 DIAGNOSIS — R1013 Epigastric pain: Secondary | ICD-10-CM

## 2018-11-26 DIAGNOSIS — K2970 Gastritis, unspecified, without bleeding: Secondary | ICD-10-CM

## 2018-11-26 DIAGNOSIS — K2990 Gastroduodenitis, unspecified, without bleeding: Secondary | ICD-10-CM

## 2018-11-26 DIAGNOSIS — K449 Diaphragmatic hernia without obstruction or gangrene: Secondary | ICD-10-CM

## 2018-11-26 MED ORDER — SODIUM CHLORIDE 0.9 % IV SOLN: 500.0000 mL | Freq: Once | INTRAVENOUS | Status: DC

## 2018-11-26 NOTE — Patient Instructions (Signed)
YOU HAD AN ENDOSCOPIC PROCEDURE TODAY AT [facility]:   Refer to the procedure report that was given to you for any specific questions about what was found during the examination.  If the procedure report does not answer your questions, please call your gastroenterologist to clarify.  If you requested that your care partner not be given the details of your procedure findings, then the procedure report has been included in a sealed envelope for you to review at your convenience later.  YOU SHOULD EXPECT: Some feelings of bloating in the abdomen. Passage of more gas than usual.  Walking can help get rid of the air that was put into your GI tract during the procedure and reduce the bloating.   Please Note:  You might notice some irritation and congestion in your nose or some drainage.  This is from the oxygen used during your procedure.  There is no need for concern and it should clear up in a day or so.  SYMPTOMS TO REPORT IMMEDIATELY:    Following upper endoscopy (EGD)  Vomiting of blood or coffee ground material  New chest pain or pain under the shoulder blades  Painful or persistently difficult swallowing  New shortness of breath  Fever of 100F or higher  Black, tarry-looking stools  For urgent or emergent issues, a gastroenterologist can be reached at any hour by calling [phone].   DIET:  We do recommend a small meal at first, but then you may proceed to your regular diet.  Drink plenty of fluids but you should avoid alcoholic beverages for 24 hours.  ACTIVITY:  You should plan to take it easy for the rest of today and you should NOT DRIVE or use heavy machinery until tomorrow (because of the sedation medicines used during the test).    FOLLOW UP: Our staff will call the number listed on your records 48-72 hours following your procedure to check on you and address any questions or concerns that you may have regarding the information given to you following your  procedure. If we do not reach you, we will leave a message.  We will attempt to reach you two times.  During this call, we will ask if you have developed any symptoms of COVID 19. If you develop any symptoms (ie: fever, flu-like symptoms, shortness of breath, cough etc.) before then, please call [phone].  If you test positive for Covid 19 in the 2 weeks post procedure, please call and report this information to [place].    If any biopsies were taken you will be contacted by phone or by letter within the next 1-3 weeks.  Please call us at [phone] if you have not heard about the biopsies in 3 weeks.    SIGNATURES/CONFIDENTIALITY: You and/or your care partner have signed paperwork which will be entered into your electronic medical record.  These signatures attest to the fact that that the information above on your After Visit Summary has been reviewed and is understood.  Full responsibility of the confidentiality of this discharge information lies with you and/or your care-partner.

## 2018-11-26 NOTE — Progress Notes (Signed)
Called to room to assist during endoscopic procedure.  Patient ID and intended procedure confirmed with present staff. Received instructions for my participation in the procedure from the performing physician.  

## 2018-11-26 NOTE — Op Note (Signed)
[facility] Patient Name: Ronald AsaChristopher Spickard Procedure Date: 11/26/2018 10:18 AM MRN: [id number] Endoscopist: Rachael Feeaniel P Walburga Hudman , MD Age: 48 Referring MD:  Date of Birth: December 17, 1970 Gender: Male Account #: [id number] Procedure:                Upper GI endoscopy Indications:              Epigastric abdominal pain Medicines:                Monitored Anesthesia Care Procedure:                Pre-Anesthesia Assessment:                           - Prior to the procedure, a History and Physical                            was performed, and patient medications and                            allergies were reviewed. The patient's tolerance of                            previous anesthesia was also reviewed. The risks                            and benefits of the procedure and the sedation                            options and risks were discussed with the patient.                            All questions were answered, and informed consent                            was obtained. Prior Anticoagulants: The patient has                            taken no previous anticoagulant or antiplatelet                            agents. [name] Grade Assessment: II - A patient with                            mild systemic disease. After reviewing the risks                            and benefits, the patient was deemed in                            satisfactory condition to undergo the procedure.                           After obtaining informed consent, the endoscope was  passed under direct vision. Throughout the                            procedure, the patient's blood pressure, pulse, and                            oxygen saturations were monitored continuously. The                            Endoscope was introduced through the mouth, and                            advanced to the second part of duodenum. The upper                            GI endoscopy was  accomplished without difficulty.                            The patient tolerated the procedure well. Scope In: Scope Out: Findings:                 Minimal inflammation characterized by erythema was                            found in the gastric antrum. Biopsies were taken                            with a cold forceps for histology.                           Small hiatal hernia.                           The exam was otherwise without abnormality. Complications:            No immediate complications. Estimated blood loss:                            None. Estimated Blood Loss:     Estimated blood loss: none. Impression:               - Minimal gastritis. Biopsied to check for H.                            pylori.                           - Small hiatal hernia.                           - The examination was otherwise normal. Recommendation:           - Patient has a contact number available for                            emergencies. The signs and symptoms of potential  delayed complications were discussed with the                            patient. Return to normal activities tomorrow.                            Written discharge instructions were provided to the                            patient.                           - Resume previous diet.                           - Continue present medications.                           - Await pathology results. If non-diagnostic then                            would likely recommend HIDA scan to check for                            biliary dyskinesia. Milus Banister, MD 11/26/2018 10:33:33 AM This report has been signed electronically.

## 2018-11-26 NOTE — Progress Notes (Signed)
Report given to PACU, vss 

## 2018-11-28 ENCOUNTER — Telehealth: Payer: Self-pay

## 2018-11-28 NOTE — Telephone Encounter (Signed)
  Follow up Call-  Call back number 11/26/2018  Post procedure Call Back phone  # [phone]  Permission to leave phone message Yes  Some recent data might be hidden     Patient questions:  Do you have a fever, pain , or abdominal swelling? No. Pain Score  0 *  Have you tolerated food without any problems? Yes.    Have you been able to return to your normal activities? Yes.    Do you have any questions about your discharge instructions: Diet   No. Medications  No. Follow up visit  No.  Do you have questions or concerns about your Care? No.  Actions: * If pain score is 4 or above: 1. No action needed, pain <4.Have you developed a fever since your procedure? no  2.   Have you had an respiratory symptoms (SOB or cough) since your procedure? no  3.   Have you tested positive for COVID 19 since your procedure no  4.   Have you had any family members/close contacts diagnosed with the COVID 19 since your procedure?  no   If yes to any of these questions please route to Joylene John, RN and Alphonsa Gin, Therapist, sports.

## 2018-11-29 ENCOUNTER — Other Ambulatory Visit: Payer: Self-pay | Admitting: Gastroenterology

## 2018-11-29 DIAGNOSIS — R1013 Epigastric pain: Secondary | ICD-10-CM

## 2018-12-02 ENCOUNTER — Other Ambulatory Visit: Payer: Self-pay

## 2018-12-02 ENCOUNTER — Encounter: Payer: Self-pay | Admitting: Family Medicine

## 2018-12-02 ENCOUNTER — Emergency Department
Admission: EM | Admit: 2018-12-02 | Discharge: 2018-12-02 | Disposition: A | Discharge status: Home / Self Care | Payer: 59 | Attending: Emergency Medicine | Admitting: Emergency Medicine

## 2018-12-02 DIAGNOSIS — Z79899 Other long term (current) drug therapy: Secondary | ICD-10-CM | POA: Insufficient documentation

## 2018-12-02 DIAGNOSIS — Z87891 Personal history of nicotine dependence: Secondary | ICD-10-CM | POA: Diagnosis not present

## 2018-12-02 DIAGNOSIS — R42 Dizziness and giddiness: Secondary | ICD-10-CM | POA: Insufficient documentation

## 2018-12-02 LAB — URINALYSIS, COMPLETE (UACMP) WITH MICROSCOPIC
Bacteria, UA: NONE SEEN
Bilirubin Urine: NEGATIVE
Glucose, UA: NEGATIVE mg/dL
Hgb urine dipstick: NEGATIVE
Ketones, ur: NEGATIVE mg/dL
Leukocytes,Ua: NEGATIVE
Nitrite: NEGATIVE
Protein, ur: NEGATIVE mg/dL
Specific Gravity, Urine: 1.01 (ref 1.005–1.030)
Squamous Epithelial / HPF: NONE SEEN (ref 0–5)
pH: 5 (ref 5.0–8.0)

## 2018-12-02 LAB — BASIC METABOLIC PANEL
Anion gap: 8 (ref 5–15)
BUN: 17 mg/dL (ref 6–20)
CO2: 27 mmol/L (ref 22–32)
Calcium: 9.2 mg/dL (ref 8.9–10.3)
Chloride: 103 mmol/L (ref 98–111)
Creatinine, Ser: 0.91 mg/dL (ref 0.61–1.24)
GFR calc Af Amer: >60 mL/min (ref >60)
GFR calc non Af Amer: >60 mL/min (ref >60)
Glucose, Bld: 103 mg/dL — ABNORMAL HIGH (ref 70–99)
Potassium: 3.8 mmol/L (ref 3.5–5.1)
Sodium: 138 mmol/L (ref 135–145)

## 2018-12-02 LAB — CBC
HCT: 41.6 % (ref 39.0–52.0)
Hemoglobin: 14.2 g/dL (ref 13.0–17.0)
MCH: 30 pg (ref 26.0–34.0)
MCHC: 34.1 g/dL (ref 30.0–36.0)
MCV: 87.9 fL (ref 80.0–100.0)
Platelets: 291 10*3/uL (ref 150–400)
RBC: 4.73 MIL/uL (ref 4.22–5.81)
RDW: 12.2 % (ref 11.5–15.5)
WBC: 7.8 10*3/uL (ref 4.0–10.5)
nRBC: 0 % (ref 0.0–0.2)

## 2018-12-02 NOTE — ED Provider Notes (Signed)
[facility] Emergency Department Provider Note  ____________________________________________   I have reviewed the triage vital signs and the nursing notes. Where available I have reviewed prior notes and, if possible and indicated, outside hospital notes.    HISTORY  Chief Complaint Weakness and Dizziness  Patient seen and evaluated during the coronavirus epidemic during a time with low staffing  HPI Ronald [name] is a 48 y.o. male who states he feels vaguely lightheaded off and on for the last 2 to 3 months.  It is not associated with abdominal pain but sometimes he also has abdominal pain.  Patient has had extensive work-up results including endoscopy cardiac work-up including echo, troponins EKGs chest x-rays, he has had thyroid studies, he is seen his PCP several times been to the emergency room several times.  Not really have any other focal complaints.  No chest pain or shortness of breath no headache no stiff neck no nausea no vomiting or change in vision or change in hearing, he does have a remote history of eye surgery but has no change in his vision.  No ear pain.  Does not endorse true vertigo.  States his symptoms are actually better when he is up and moving and worse when he lies still."  Denies depression.  Is employed.  No recent travel no recent tick bite.  No focal findings or complaints.  He did not have any abdominal pain at this time.  No diarrhea.  No melena no bright red blood per rectum no hematemesis.  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  He denies any symptoms in his wife.  He states he and his wife are happily married.  He does not feel depressed.  He has had no symptoms at this time but earlier today he felt lightheaded as he has been feeling off and on for several months.  He has not fallen he has not passed out he  has not had any chest pain he has not had any shortness of breath he has not had any exertional symptoms he has no personal family history of PE or DVT he has had no leg swelling he has had no recent travel he takes no pro-coagulation medications, he does not take any significant stents.  His thyroid is recently completely normal.  He has no complaints at this time he just wants to make sure "everything is okay" has not had any vomiting, he has had no weight loss, he has had no loss of taste.  He has had no fevers.  He does not think he has had coronavirus.  His symptoms have been going on for 2 months.  He is quite willing to go home but he wants to know whom he should see next.  He is getting a HIDA scan of his gallbladder because he has some gallbladder sludge.  He has no food associated discomfort.  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  No sore throat.  No lymphadenopathy.    History reviewed. No pertinent past medical history.  Patient Active Problem List   Diagnosis Date Noted  . ETD (eustachian tube dysfunction) 05/12/2015  . General medical exam 08/14/2012    Past Surgical History:  Procedure Laterality Date  .  Torn eye muscle Left 2008    Prior to Admission medications   Medication Sig Start Date End Date Taking? Authorizing Provider  aspirin EC 325 MG tablet Take 650 mg by mouth daily as needed (headache).    [provider]  hydrOXYzine (ATARAX/VISTARIL) 25 MG tablet Take 1 tablet (25 mg total) by mouth 3 (three) times daily as needed. 10/31/18   Guse, Janna ArchLauren M, FNP  Omega-3 1000 MG CAPS Take 1,000 mg by mouth daily.    [provider]  pantoprazole (PROTONIX) 20 MG tablet Take 1 tablet (20 mg total) by mouth daily for 30 days. 10/27/18 11/26/18  Aviva KluverMurray, Alyssa B, PA-C  sucralfate (CARAFATE) 1 g tablet Take 1 tablet (1 g total) by mouth 4  (four) times daily -  with meals and at bedtime for 10 days. 10/27/18 11/06/18  Aviva KluverMurray, Alyssa B, PA-C    Allergies Patient has no known allergies.  Family History  Family history unknown: Yes    Social History Social History   Tobacco Use  . Smoking status: Former Smoker    Packs/day: 0.50    Years: 10.00    Pack years: 5.00    Types: Cigarettes    Quit date: 06/17/2011    Years since quitting: 7.4  . Smokeless tobacco: Never Used  Substance Use Topics  . Alcohol use: No  . Drug use: No    Review of Systems Constitutional: No fever/chills Eyes: No visual changes. ENT: No sore throat. No stiff neck no neck pain Cardiovascular: Denies chest pain. Respiratory: Denies shortness of breath. Gastrointestinal:   no vomiting.  No diarrhea.  No constipation. Genitourinary: Negative for dysuria. Musculoskeletal: Negative lower extremity swelling Skin: Negative for rash. Neurological: Negative for severe headaches, focal weakness or numbness.   ____________________________________________   PHYSICAL EXAM:  VITAL SIGNS: ED Triage Vitals  Enc Vitals Group     BP 12/02/18 1627 (!) 126/92     Pulse Rate 12/02/18 1627 77     Resp 12/02/18 1627 18     Temp 12/02/18 1627 99.6 F (37.6 C)     Temp Source 12/02/18 1627 Oral     SpO2 12/02/18 1627 100 %     Weight 12/02/18 1628 175 lb (79.4 kg)     Height 12/02/18 1628 6\' 2"  (1.88 m)     Head Circumference --      Peak Flow --      Pain Score 12/02/18 1628 0     Pain Loc --      Pain Edu? --      Excl. in GC? --     Constitutional: Alert and oriented. Well appearing and in no acute distress. Eyes: Conjunctivae are normal Head: Atraumatic HEENT: No congestion/rhinnorhea. Mucous membranes are moist.  Oropharynx non-erythematous Neck:   Nontender with no meningismus, no masses, no stridor Cardiovascular: Normal rate, regular rhythm. Grossly normal heart sounds.  Good peripheral circulation. Respiratory: Normal respiratory  effort.  No retractions. Lungs CTAB. Abdominal: Soft and nontender. No distention. No guarding no rebound Back:  There is no focal tenderness or step off.  there is no midline tenderness there are no lesions noted. there is no CVA tenderness Musculoskeletal: No lower extremity tenderness, no upper extremity tenderness. No joint effusions, no DVT signs strong distal pulses no edema Neurologic: Cranial nerves II through XII are grossly intact 5 out of 5 strength bilateral upper and lower extremity. Finger to nose within normal limits heel to shin within normal limits, speech is normal  with no word finding difficulty or dysarthria, reflexes symmetric, pupils are equally round and reactive to light, there is no pronator drift, sensation is normal, vision is intact to confrontation, gait is deferred, there is no nystagmus, normal neurologic exam Skin:  Skin is warm, dry and intact. No rash noted. Psychiatric: Mood and affect are a little bit anxious. Speech and behavior are normal.  ____________________________________________   LABS (all labs ordered are listed, but only abnormal results are displayed)  Labs Reviewed  BASIC METABOLIC PANEL - Abnormal; Notable for the following components:      Result Value   Glucose, Bld 103 (*)    All other components within normal limits  URINALYSIS, COMPLETE (UACMP) WITH MICROSCOPIC - Abnormal; Notable for the following components:   Color, Urine YELLOW (*)    APPearance CLEAR (*)    All other components within normal limits  CBC    Pertinent labs  results that were available during my care of the patient were reviewed by me and considered in my medical decision making (see chart for details). ____________________________________________  EKG  I personally interpreted any EKGs ordered by me or triage No acute ischemic changes normal sinus rhythm ____________________________________________  RADIOLOGY  Pertinent labs & imaging results that were  available during my care of the patient were reviewed by me and considered in my medical decision making (see chart for details). If possible, patient and/or family made aware of any abnormal findings.  No results found. ____________________________________________    PROCEDURES  Procedure(s) performed: None  Procedures  Critical Care performed: None  ____________________________________________   INITIAL IMPRESSION / ASSESSMENT AND PLAN / ED COURSE  Pertinent labs & imaging results that were available during my care of the patient were reviewed by me and considered in my medical decision making (see chart for details).  Patient here with 2 months worth of intermittently feeling "blah".  The differential is quite large.  Nothing to suggest carbon monoxide poisoning nothing to suggest acute infectious pathology no recent tick bites, nothing to suggest coagulopathy such as PE or DVT nothing to suggest acute endocrine pathology.  Has had comprehensive work-up for this.  No evidence of acute coronary syndrome no evidence of acute GI pathology today requiring imaging.  Abdomen is completely benign.  There is certainly the possibility of oncologic process but we are not can figure that out in the emergency room there is certainly the possibility of cardiac dysrhythmia but there is no evidence of it in the emergency department today and this is been going on for some time.  Certainly some degree of autonomic instability could be causing some of his recurrent symptoms.  Certainly there are infectious pathologies related to tick bites or other issues like that that could cause his symptoms.  In short, there is a host of things which have yet to be ruled out to cause him to feel generally unwell intermittently for several months but there is no clear indication for admission and he strongly would prefer to go home he is a very vigorous and well-appearing man in no acute distress with a normal neurologic  exam reassuring EKG reassuring blood work reassuring vitals etc.  I talked about admission versus discharge and a strong preference would be to go home.  I think that he does need to continue coordinating with his primary care doctor.  He also I believe should follow closely with cardiology as a neck staff to make sure he is not suffering from any dysrhythmias.  If he has any presyncopal or syncopal symptoms or he feels worse in any way he understands he must come back.  Otherwise, he is content to go home after his third ER visit for feeling vaguely unwell in the last several months.  I have encouraged him to follow closely with primary care and to return if he feels worse.    ____________________________________________   FINAL CLINICAL IMPRESSION(S) / ED DIAGNOSES  Final diagnoses:  None      This chart was dictated using voice recognition software.  Despite best efforts to proofread,  errors can occur which can change meaning.      Jeanmarie PlantMcShane, Betty Brooks A, MD 12/02/18 [phone]

## 2018-12-02 NOTE — Discharge Instructions (Signed)
Return to the emergency room if you feel any new or worrisome symptoms including chest pain shortness of breath, if you have numbness or weakness especially weakness in 1 part of your body or difficulty speaking, if if you feel worse in any way.  Otherwise, please follow-up with your primary care doctor, the cardiologist listed above in the neurologist listed above.

## 2018-12-02 NOTE — ED Triage Notes (Signed)
Weakness and dizziness that began upon awakening. Pt has recent hx of chest pain and has had multiple workups without a clear cause. Pt reports trouble standing due to fatigue and generalized weakness. Denies CP. Pt alert and oriented X4, active, cooperative, pt in NAD. RR even and unlabored, color WNL.

## 2018-12-03 ENCOUNTER — Encounter: Payer: Self-pay | Admitting: Family Medicine

## 2018-12-03 ENCOUNTER — Ambulatory Visit (INDEPENDENT_AMBULATORY_CARE_PROVIDER_SITE_OTHER): Payer: 59 | Admitting: Family Medicine

## 2018-12-03 ENCOUNTER — Other Ambulatory Visit: Payer: Self-pay

## 2018-12-03 VITALS — BP 122/86 | HR 98 | Temp 98.4°F | Resp 16 | Ht 74.0 in | Wt 179.4 lb

## 2018-12-03 DIAGNOSIS — R1013 Epigastric pain: Secondary | ICD-10-CM

## 2018-12-03 DIAGNOSIS — F419 Anxiety disorder, unspecified: Secondary | ICD-10-CM | POA: Diagnosis not present

## 2018-12-03 MED ORDER — HYDROXYZINE HCL 25 MG PO TABS: 25.0000 mg | ORAL_TABLET | Freq: Three times a day (TID) | ORAL | 2 refills | Status: DC | PRN

## 2018-12-03 MED ORDER — ESCITALOPRAM OXALATE 10 MG PO TABS: 10.0000 mg | ORAL_TABLET | Freq: Every day | ORAL | 2 refills | Status: DC

## 2018-12-03 NOTE — Progress Notes (Signed)
Subjective:    Patient ID: Ronald [name], male    DOB: [birth year], 48 y.o.   MRN: [id number]  HPI   Patient presents to clinic due to continued feelings of dizziness, weakness, upper abdominal pain.  States the feelings of dizziness and weakness will come on suddenly and he will feel at wash over his body.  Had an episode yesterday where his joints felt weak, felt dizzy, and also felt tingling in all of his fingers.  States he took a hydroxyzine and this helped him to feel better. However, these feelings prompted him to go to emergency room for work-up.  Lab work done in ER unremarkable, EKG done in ER unremarkable.  Patient was also seen in the ER on 10/29/2018 for similar issues and work-up in the ER at that time was.  Here for complete physical exam and we repeated some lab work and also did echocardiogram which came back normal.  He is also seeing GI had endoscopy and also has upcoming HIDA scan scheduled.  Patient also had abdominal ultrasound which was unremarkable for any abnormalities.  Denies feeling stressed more than usual any obvious signs of anxiety or depression.  Denies any SI or HI.  States he is becoming frustrated that all of his work-ups are coming back normal, but he is still having these episodes of dizziness, weakness and abdominal pain.  Patient Active Problem List   Diagnosis Date Noted  . ETD (eustachian tube dysfunction) 05/12/2015  . General medical exam 08/14/2012   Social History   Tobacco Use  . Smoking status: Former Smoker    Packs/day: 0.50    Years: 10.00    Pack years: 5.00    Types: Cigarettes    Quit date: 06/17/2011    Years since quitting: 7.4  . Smokeless tobacco: Never Used  Substance Use Topics  . Alcohol use: No   Past Surgical History:  Procedure Laterality Date  . Torn eye muscle Left 2008   Family History  Family history unknown: Yes   Review of Systems  Constitutional: Negative for chills, fatigue and fever.  HENT:  Negative for congestion, ear pain, sinus pain and sore throat.   Eyes: Negative.   Respiratory: Negative for cough, shortness of breath and wheezing.   Cardiovascular: Negative for chest pain, palpitations and leg swelling.  Gastrointestinal: +upper ABD pain at times. Negative for diarrhea, nausea and vomiting.  Genitourinary: Negative for dysuria, frequency and urgency.  Musculoskeletal: Negative for arthralgias and myalgias.  Skin: Negative for color change, pallor and rash.  Neurological: Negative for syncope. +dizziness and weakness at times Psychiatric/Behavioral: +anxious       Objective:   Physical Exam Vitals signs and nursing note reviewed.  Constitutional:      General: He is not in acute distress.    Appearance: He is normal weight. He is not ill-appearing, toxic-appearing or diaphoretic.  HENT:     Head: Normocephalic and atraumatic.     Right Ear: Tympanic membrane, ear canal and external ear normal.     Left Ear: Tympanic membrane, ear canal and external ear normal.  Eyes:     General: No scleral icterus.    Extraocular Movements: Extraocular movements intact.     Conjunctiva/sclera: Conjunctivae normal.     Pupils: Pupils are equal, round, and reactive to light.  Neck:     Musculoskeletal: Normal range of motion. No neck rigidity.     Vascular: No carotid bruit.  Cardiovascular:     Rate and Rhythm:  Normal rate and regular rhythm.     Heart sounds: Normal heart sounds.  Pulmonary:     Effort: Pulmonary effort is normal. No respiratory distress.     Breath sounds: Normal breath sounds.  Abdominal:     General: Bowel sounds are normal. There is no distension.     Palpations: Abdomen is soft. There is no mass.     Tenderness: There is no abdominal tenderness. There is no right CVA tenderness, left CVA tenderness, guarding or rebound.     Hernia: No hernia is present.  Musculoskeletal:     Right lower leg: No edema.     Left lower leg: No edema.  Skin:     General: Skin is warm and dry.     Coloration: Skin is not jaundiced or pale.  Neurological:     General: No focal deficit present.     Mental Status: He is alert and oriented to person, place, and time.     Cranial Nerves: No cranial nerve deficit.     Motor: No weakness.     Gait: Gait normal.  Psychiatric:        Attention and Perception: Attention normal.        Mood and Affect: Mood is anxious.        Thought Content: Thought content normal.        Judgment: Judgment normal.    Vitals:   12/03/18 1600  BP: 122/86  Pulse: 98  Resp: 16  Temp: 98.4 F (36.9 C)  SpO2: 97%      Assessment & Plan:     A total of 25  minutes were spent face-to-face with the patient during this encounter and over half of that time was spent on counseling and coordination of care. The patient was counseled on ways anxiety can present itself and how I do believe anxiety is at play in regards to his symptoms and other work-up being negative.  Anxiety-patient agreeable to begin medication.  He will start Lexapro 10 mg daily and will continue to use hydroxyzine as needed for breakthrough anxiety.  Discussed keeping self well-hydrated, eating a balanced diet, getting proper sleep and also discussed anxiety reducing techniques such as deep breathing, meditation, exercising.  ABD Pain-He will keep HIDA scan as scheduled by GI for continued work-up of the abdominal pain.   He will follow-up here in approximately 3 or 4 weeks for recheck on mood after starting Lexapro.  He is aware he can call clinic sooner if any issues arise.

## 2018-12-03 NOTE — Patient Instructions (Signed)

## 2018-12-04 DIAGNOSIS — F419 Anxiety disorder, unspecified: Secondary | ICD-10-CM | POA: Insufficient documentation

## 2018-12-04 DIAGNOSIS — F411 Generalized anxiety disorder: Secondary | ICD-10-CM | POA: Insufficient documentation

## 2018-12-04 HISTORY — DX: Anxiety disorder, unspecified: F41.9

## 2018-12-12 ENCOUNTER — Encounter (HOSPITAL_COMMUNITY)
Admission: RE | Admit: 2018-12-12 | Discharge: 2018-12-12 | Disposition: A | Discharge status: Home / Self Care | Payer: 59 | Source: Ambulatory Visit | Attending: Gastroenterology | Admitting: Gastroenterology

## 2018-12-12 ENCOUNTER — Other Ambulatory Visit: Payer: Self-pay

## 2018-12-12 DIAGNOSIS — R1013 Epigastric pain: Secondary | ICD-10-CM | POA: Diagnosis present

## 2018-12-12 MED ORDER — TECHNETIUM TC 99M MEBROFENIN IV KIT
5.4000 | PACK | Freq: Once | INTRAVENOUS | Status: AC | PRN
  Administered 2018-12-12: 5.4 via INTRAVENOUS

## 2018-12-17 ENCOUNTER — Other Ambulatory Visit: Payer: Self-pay

## 2018-12-17 ENCOUNTER — Encounter: Payer: Self-pay | Admitting: Family Medicine

## 2018-12-17 ENCOUNTER — Ambulatory Visit: Payer: 59 | Admitting: Family Medicine

## 2018-12-17 VITALS — BP 120/86 | HR 83 | Temp 98.5°F | Resp 18 | Ht 74.0 in | Wt 178.6 lb

## 2018-12-17 DIAGNOSIS — F419 Anxiety disorder, unspecified: Secondary | ICD-10-CM

## 2018-12-17 DIAGNOSIS — R1013 Epigastric pain: Secondary | ICD-10-CM

## 2018-12-17 MED ORDER — PANTOPRAZOLE SODIUM 20 MG PO TBEC: 20.0000 mg | DELAYED_RELEASE_TABLET | Freq: Every day | ORAL | 1 refills | Status: DC

## 2018-12-17 MED ORDER — ESCITALOPRAM OXALATE 5 MG PO TABS: 5.0000 mg | ORAL_TABLET | Freq: Every day | ORAL | 2 refills | Status: DC

## 2018-12-17 NOTE — Progress Notes (Signed)
Subjective:    Patient ID: Ronald Ronald [name], male    DOB: 11/28/1970, 48 y.o.   MRN: [id number]  HPI   Patient presents to clinic to follow-up Ronald [name] anxiety since starting Lexapro.  Patient had been having episodes of upper abdominal pain, chest pain and had gone to ER Ronald [name] multiple occasions for the symptoms, his work-up always came back negative.  He also has seen GI for evaluation of epigastric pain, testing done by GI came back unremarkable.  We did echocardiogram to look at heart and multiple EKGs were unremarkable for any issues as well.  Since starting Lexapro, patient feels much better.  Still has times of nervousness that seem to peak through in the afternoons but overall feeling much improved and not having any more breakthrough episodes of chest pain, epigastric pain and generalized weakness come over him.  Denies any SI or HI.  Patient Active Problem List   Diagnosis Date Noted  . Anxiety 12/04/2018  . ETD (eustachian tube dysfunction) 05/12/2015  . General medical exam 08/14/2012   Social History   Tobacco Use  . Smoking status: Former Smoker    Packs/day: 0.50    Years: 10.00    Pack years: 5.00    Types: Cigarettes    Quit date: 06/17/2011    Years since quitting: 7.5  . Smokeless tobacco: Never Used  Substance Use Topics  . Alcohol use: No    Review of Systems   Constitutional: Negative for chills, fatigue and fever.  HENT: Negative for congestion, ear pain, sinus pain and sore throat.   Eyes: Negative.   Respiratory: Negative for cough, shortness of breath and wheezing.   Cardiovascular: Negative for chest pain, palpitations and leg swelling.  Gastrointestinal: Negative for abdominal pain, diarrhea, nausea and vomiting.  Genitourinary: Negative for dysuria, frequency and urgency.  Musculoskeletal: Negative for arthralgias and myalgias.  Skin: Negative for color change, pallor and rash.  Neurological: Negative for syncope, light-headedness and headaches.   Psychiatric/Behavioral: The patient is not nervous/anxious.       Objective:   Physical Exam Vitals signs and nursing note reviewed.  Constitutional:      General: He is not in acute distress.    Appearance: He is normal weight. He is not ill-appearing, toxic-appearing or diaphoretic.  Eyes:     General: No scleral icterus.    Extraocular Movements: Extraocular movements intact.     Conjunctiva/sclera: Conjunctivae normal.     Pupils: Pupils are equal, round, and reactive to light.  Cardiovascular:     Rate and Rhythm: Normal rate and regular rhythm.  Pulmonary:     Effort: Pulmonary effort is normal. No respiratory distress.     Breath sounds: Normal breath sounds.  Skin:    General: Skin is warm and dry.     Coloration: Skin is not jaundiced or pale.  Neurological:     General: No focal deficit present.     Mental Status: He is alert and oriented to person, place, and time.     Gait: Gait normal.  Psychiatric:        Mood and Affect: Mood normal.        Behavior: Behavior normal.        Thought Content: Thought content normal.        Judgment: Judgment normal.    Today's Vitals   12/17/18 1447  BP: 120/86  Pulse: 83  Resp: 18  Temp: 98.5 F (36.9 C)  TempSrc: Oral  SpO2: 97%  Weight:  178 lb 9.6 oz (81 kg)  Height: 6\' 2"  (1.88 m)   Body mass index is 22.93 kg/m.     Assessment & Plan:    Anxiety-patient will continue Lexapro 10 mg in the a.m. and we will trial addition of 5 mg dose to be taken in the early afternoon.  Patient declines referral to any sort of counseling at this time and would like to see how this increase in dosage works, he is pleased overall with the effects of Lexapro and is hopeful that the slight increase in dosage will be beneficial.  Epigastric pain- patient does have epigastric pain off and Ronald [name].  States GI had mentioned to him he can take an acid reduction type medication to help combat this.  I will send in Protonix 20 mg to be taken  once daily.  Patient will follow-up in approximately 4 weeks for recheck after increase in Lexapro dosage.  He is aware he can call office anytime if questions or concerns arise.

## 2019-01-21 ENCOUNTER — Ambulatory Visit (INDEPENDENT_AMBULATORY_CARE_PROVIDER_SITE_OTHER): Payer: 59 | Admitting: Family Medicine

## 2019-01-21 ENCOUNTER — Other Ambulatory Visit: Payer: Self-pay

## 2019-01-21 DIAGNOSIS — F419 Anxiety disorder, unspecified: Secondary | ICD-10-CM

## 2019-01-21 MED ORDER — ESCITALOPRAM OXALATE 10 MG PO TABS: 10.0000 mg | ORAL_TABLET | Freq: Two times a day (BID) | ORAL | 1 refills | Status: DC

## 2019-01-21 NOTE — Progress Notes (Signed)
Subjective:    Patient ID: Ronald [name], male    DOB: [birth year], 48 y.o.   MRN: [id number]  HPI   Patient presents to clinic for follow-up anxiety after increasing Lexapro from 10 mg to 15 mg/day.  Overall does feel the increase has helped, but thinks he might need to go up just a little bit more.  Sarson noticed the dose wearing off by the end of the day.  Currently has been taking 10 mg in the morning and 5 mg in the evening and believes doing 10 mg both in the a.m. and p.m. would probably be more beneficial.  Denies any SI or HI.  Everything at his job has been going well, he has been getting a lot of hours and has no concerns over any layoffs related to pandemic closures.   Patient Active Problem List   Diagnosis Date Noted  . Anxiety 12/04/2018  . ETD (eustachian tube dysfunction) 05/12/2015  . General medical exam 08/14/2012   Social History   Tobacco Use  . Smoking status: Former Smoker    Packs/day: 0.50    Years: 10.00    Pack years: 5.00    Types: Cigarettes    Quit date: 06/17/2011    Years since quitting: 7.6  . Smokeless tobacco: Never Used  Substance Use Topics  . Alcohol use: No     Review of Systems  Constitutional: Negative for chills, fatigue and fever.  HENT: Negative for congestion, ear pain, sinus pain and sore throat.   Eyes: Negative.   Respiratory: Negative for cough, shortness of breath and wheezing.   Cardiovascular: Negative for chest pain, palpitations and leg swelling.  Gastrointestinal: Negative for abdominal pain, diarrhea, nausea and vomiting.  Genitourinary: Negative for dysuria, frequency and urgency.  Musculoskeletal: Negative for arthralgias and myalgias.  Skin: Negative for color change, pallor and rash.  Neurological: Negative for syncope, light-headedness and headaches.  Psychiatric/Behavioral: The patient is not nervous/anxious.       Objective:   Physical Exam Vitals signs and nursing note reviewed.  Constitutional:       General: He is not in acute distress.    Appearance: He is not ill-appearing, toxic-appearing or diaphoretic.  HENT:     Head: Normocephalic and atraumatic.  Eyes:     General: No scleral icterus.    Extraocular Movements: Extraocular movements intact.     Conjunctiva/sclera: Conjunctivae normal.     Pupils: Pupils are equal, round, and reactive to light.  Cardiovascular:     Rate and Rhythm: Normal rate and regular rhythm.     Heart sounds: Normal heart sounds.  Pulmonary:     Effort: Pulmonary effort is normal. No respiratory distress.     Breath sounds: Normal breath sounds.  Musculoskeletal:     Right lower leg: No edema.     Left lower leg: No edema.  Skin:    General: Skin is warm and dry.  Neurological:     General: No focal deficit present.     Mental Status: He is alert and oriented to person, place, and time.  Psychiatric:        Mood and Affect: Mood normal.        Behavior: Behavior normal.        Thought Content: Thought content normal.        Judgment: Judgment normal.     Today's Vitals   01/21/19 1524  BP: 104/68  Pulse: 80  Resp: 16  Temp: 98.3 F (36.8 C)  TempSrc: Temporal  SpO2: 97%  Weight: 179 lb 9.6 oz (81.5 kg)  Height: 6\' 2"  (1.88 m)   Body mass index is 23.06 kg/m.    Assessment & Plan:    Anxiety-patient will increase total daily dose of Lexapro to 20 mg/day.  He will take 10 mg in the a.m. and 10 mg in the p.m.  Offered again a referral to counseling, he declines due to busy work schedule.  Does feel Lexapro is helping him and is very happy with his progress.  Patient will follow-up in approximately 6 to 8 weeks for recheck aware he can return to clinic sooner if any issues arise

## 2019-03-24 ENCOUNTER — Encounter: Payer: Self-pay | Admitting: Family Medicine

## 2019-03-24 ENCOUNTER — Other Ambulatory Visit: Payer: Self-pay

## 2019-03-24 ENCOUNTER — Ambulatory Visit: Payer: 59 | Admitting: Family Medicine

## 2019-03-24 VITALS — BP 102/70 | HR 80 | Temp 98.3°F | Wt 178.6 lb

## 2019-03-24 DIAGNOSIS — F419 Anxiety disorder, unspecified: Secondary | ICD-10-CM | POA: Diagnosis not present

## 2019-03-24 NOTE — Progress Notes (Signed)
Subjective:    Patient ID: Ronald [name], male    DOB: 06-06-1970, 48 y.o.   MRN: [id number]  HPI   Patient presents to clinic for follow-up on his anxiety.  We bumped Lexapro up from 15 mg to 20 mg daily.  Overall he is feeling quite well.  Feels his anxiety is improved.  He is no longer having symptoms of chest pain that he was having before that ended up bringing him to the ER.  Still has times where he notices anxiety breaking through, but overall feels like he is able to handle it.  Has good support from family, friends and enjoys his job.  Is able to sleep well.  Is eating and drinking normally.  No SI or HI.  Patient Active Problem List   Diagnosis Date Noted  . Anxiety 12/04/2018  . ETD (eustachian tube dysfunction) 05/12/2015  . General medical exam 08/14/2012   Social History   Tobacco Use  . Smoking status: Former Smoker    Packs/day: 0.50    Years: 10.00    Pack years: 5.00    Types: Cigarettes    Quit date: 06/17/2011    Years since quitting: 7.7  . Smokeless tobacco: Never Used  Substance Use Topics  . Alcohol use: No   Review of Systems   Constitutional: Negative for chills, fatigue and fever.  HENT: Negative for congestion, ear pain, sinus pain and sore throat.   Eyes: Negative.   Respiratory: Negative for cough, shortness of breath and wheezing.   Cardiovascular: Negative for chest pain, palpitations and leg swelling.  Gastrointestinal: Negative for abdominal pain, diarrhea, nausea and vomiting.  Genitourinary: Negative for dysuria, frequency and urgency.  Musculoskeletal: Negative for arthralgias and myalgias.  Skin: Negative for color change, pallor and rash.  Neurological: Negative for syncope, light-headedness and headaches.  Psychiatric/Behavioral: The patient is not nervous/anxious.       Objective:   Physical Exam Vitals signs and nursing note reviewed.  Constitutional:      General: He is not in acute distress.    Appearance: He is not  toxic-appearing.  HENT:     Head: Normocephalic and atraumatic.  Eyes:     General: No scleral icterus.    Extraocular Movements: Extraocular movements intact.     Conjunctiva/sclera: Conjunctivae normal.     Pupils: Pupils are equal, round, and reactive to light.  Neck:     Musculoskeletal: Normal range of motion and neck supple. No neck rigidity.  Cardiovascular:     Rate and Rhythm: Normal rate and regular rhythm.     Heart sounds: Normal heart sounds.  Pulmonary:     Effort: Pulmonary effort is normal. No respiratory distress.     Breath sounds: Normal breath sounds.  Musculoskeletal:     Right lower leg: No edema.     Left lower leg: No edema.  Skin:    General: Skin is warm and dry.     Coloration: Skin is not jaundiced or pale.  Neurological:     Mental Status: He is alert and oriented to person, place, and time.     Gait: Gait normal.  Psychiatric:        Mood and Affect: Mood normal.        Behavior: Behavior normal.    Today's Vitals   03/24/19 1524  BP: 102/70  Pulse: 80  Temp: 98.3 F (36.8 C)  SpO2: 98%  Weight: 178 lb 9.6 oz (81 kg)   Body mass index  is 22.93 kg/m.     Assessment & Plan:    Anxiety-we will continue Lexapro 20 mg/day.  Patient feels well on this dose and stable.  Will reach out to [place] if anything changes from his questions or concerns.  Otherwise routine follow-up every 6 months.

## 2019-07-24 ENCOUNTER — Encounter: Payer: Self-pay | Admitting: Nurse Practitioner

## 2019-07-24 ENCOUNTER — Ambulatory Visit: Payer: 59 | Admitting: Nurse Practitioner

## 2019-07-24 ENCOUNTER — Other Ambulatory Visit: Payer: Self-pay

## 2019-07-24 VITALS — BP 128/84 | HR 87 | Temp 98.2°F | Resp 16 | Ht 74.0 in | Wt 188.0 lb

## 2019-07-24 DIAGNOSIS — M25462 Effusion, left knee: Secondary | ICD-10-CM

## 2019-07-24 DIAGNOSIS — K219 Gastro-esophageal reflux disease without esophagitis: Secondary | ICD-10-CM

## 2019-07-24 DIAGNOSIS — M25562 Pain in left knee: Secondary | ICD-10-CM | POA: Diagnosis not present

## 2019-07-24 DIAGNOSIS — F419 Anxiety disorder, unspecified: Secondary | ICD-10-CM | POA: Diagnosis not present

## 2019-07-24 MED ORDER — ESCITALOPRAM OXALATE 10 MG PO TABS: 10.0000 mg | ORAL_TABLET | Freq: Two times a day (BID) | ORAL | 0 refills | Status: DC

## 2019-07-24 NOTE — Progress Notes (Signed)
Subjective:    Patient ID: Ronald [name], male    DOB: May 18, 1970, 49 y.o.   MRN: [id number]  HPI  1. He is requesting refills on Lexapro 10 mg BID that was started 11/2018. He is doing well and has no anxiety. He feels great on the medication. PHQ9 is zero, GAD7 is zero.  Weight gain as noted but normal BMI 24.  Wt Readings from Last 3 Encounters:  07/24/19 188 lb (85.3 kg)  03/24/19 178 lb 9.6 oz (81 kg)  01/21/19 179 lb 9.6 oz (81.5 kg)   2. He also wants his left knee examined. He felt a pop behind his left knee and noticed a swelling recently. He says he may have pulled something.The swelling comes and goes. He can wear a knee brace and it resolves but the next day, it is back again. He has had no falls and the knee does not give out. Slight discomfort about the knee. No calf pain or knee or leg swelling.  No hx of past or current knee injury or trauma. He walks at work, uses steps, no other exercise activity.   PMH, PSH, SH, Problem list reviewed.   BP 128/84   Pulse 87   Temp 98.2 F (36.8 C) (Temporal)   Resp 16   Ht 6\' 2"  (1.88 m)   Wt 188 lb (85.3 kg)   SpO2 96%   BMI 24.14 kg/m   Review of Systems  Constitutional: Negative.  Negative for chills and fever.  HENT: Negative.   Respiratory: Negative.   Cardiovascular: Negative.   Gastrointestinal: Negative.   Endocrine: Negative.   Genitourinary:       GERD well managed with as needed Prilosec. He had EGD with mild gastritis.   Musculoskeletal:       See HPI. Right knee is normal. Left hip and ankle normal.   Skin: Negative.   Neurological: Negative.   Psychiatric/Behavioral:       No active depression or anxiety on Lexapro.      Objective:   Physical Exam Vitals reviewed.  Constitutional:      Appearance: Normal appearance. He is normal weight.  HENT:     Head: Normocephalic.  Cardiovascular:     Rate and Rhythm: Normal rate and regular rhythm.     Heart sounds: Normal heart sounds. No murmur.    Pulmonary:     Effort: Pulmonary effort is normal.     Breath sounds: Normal breath sounds.  Abdominal:     General: Abdomen is flat.     Palpations: Abdomen is soft.     Tenderness: There is no abdominal tenderness.  Musculoskeletal:        General: Normal range of motion.     Comments: Positive slight bulge left post knee- suspect Baker's cyst. Anterior knee is normal, but slight tenderness medial and lateral areas. Calf is non tender bilateral.  Ambulates normally. Normal ROM.  Skin:    General: Skin is warm and dry.  Neurological:     General: No focal deficit present.     Mental Status: He is alert and oriented to person, place, and time.  Psychiatric:        Mood and Affect: Mood normal.        Behavior: Behavior normal.        Thought Content: Thought content normal.        Judgment: Judgment normal.        Assessment & Plan:   1. Anxiety: well  controlled on high dose Lexapro. Started last summer. Consider titration   schedule in the future. Refilled Lexapro 10 mg BID for  3 mos.  2. Left posterior knee bulge and slight knee joint discomfort. Suspect Baker's cyst. Referral to [facility] for testing and management.  3. Return for CPE in 3 mos.   This visit occurred during the SARS-CoV-2 public health emergency.  Safety protocols were in place, including screening questions prior to the visit, additional usage of staff PPE, and extensive cleaning of exam room while observing appropriate contact time as indicated for disinfecting solutions.   Amedeo Kinsman, ANP

## 2019-07-24 NOTE — Patient Instructions (Addendum)
It was nice to meet you today.   I refilled your Lexapro for 3 mos.   I have sent off a referral to [facility] to investigate your left knee posterior swelling for Baker's cyst.   You are due for a complete physical in June. Please arrange a follow up appt for that.

## 2019-07-25 ENCOUNTER — Encounter: Payer: Self-pay | Admitting: Nurse Practitioner

## 2019-07-25 DIAGNOSIS — K219 Gastro-esophageal reflux disease without esophagitis: Secondary | ICD-10-CM

## 2019-07-25 HISTORY — DX: Gastro-esophageal reflux disease without esophagitis: K21.9

## 2019-10-21 ENCOUNTER — Other Ambulatory Visit: Payer: Self-pay | Admitting: Nurse Practitioner

## 2019-10-21 DIAGNOSIS — F419 Anxiety disorder, unspecified: Secondary | ICD-10-CM

## 2019-10-22 ENCOUNTER — Other Ambulatory Visit: Payer: Self-pay

## 2019-10-24 ENCOUNTER — Encounter: Payer: Self-pay | Admitting: Nurse Practitioner

## 2019-10-24 ENCOUNTER — Other Ambulatory Visit: Payer: Self-pay

## 2019-10-24 ENCOUNTER — Ambulatory Visit: Payer: 59 | Admitting: Nurse Practitioner

## 2019-10-24 VITALS — BP 118/74 | HR 79 | Temp 98.1°F | Ht 74.0 in | Wt 191.2 lb

## 2019-10-24 DIAGNOSIS — M7122 Synovial cyst of popliteal space [Baker], left knee: Secondary | ICD-10-CM

## 2019-10-24 DIAGNOSIS — F419 Anxiety disorder, unspecified: Secondary | ICD-10-CM | POA: Diagnosis not present

## 2019-10-24 MED ORDER — HYDROXYZINE HCL 10 MG PO TABS: 10.0000 mg | ORAL_TABLET | Freq: Three times a day (TID) | ORAL | 0 refills | Status: DC | PRN

## 2019-10-24 NOTE — Progress Notes (Signed)
Established Patient Office Visit  Subjective:  Patient ID: Ronald [name], male    DOB: 1971/01/06  Age: 49 y.o. MRN: [id number]  CC:  Chief Complaint  Patient presents with  . Follow-up    HPI Ronald [name] is a 49 year old patient comes in for follow-up visit.  Baker's cyst: Seen in March for Baker's cyst.  He saw orthopedics and he comes in today wearing a knee compression stocking.  He wears this during the day when he is on his feet.  It is working very well and he has no further problems.  He does not think he is going to need to injection.     Anxiety: On Lexapro 10 mg daily and he reports his anxiety is in excellent control.  Although, he will get a random mini panic attack maybe once or twice a month.  Short-lived, but it is present. He  identified no triggers.  He declines behavioral  Psychotherapy.  GAD-7 score today is 1, PHQ-9 is 0.  No SI.  He is getting biometrics once a year- Tetanus needed.  Past Medical History:  Diagnosis Date  . Anxiety 12/04/2018  . GERD without esophagitis 07/25/2019    Past Surgical History:  Procedure Laterality Date  . Torn eye muscle Left 2008    Family History  Family history unknown: Yes    Social History   Socioeconomic History  . Marital status: Married    Spouse name: Not on file  . Number of children: Not on file  . Years of education: 42  . Highest education level: Not on file  Occupational History  . Occupation: Production designer, theatre/television/film  Tobacco Use  . Smoking status: Former Smoker    Packs/day: 0.50    Years: 10.00    Pack years: 5.00    Types: Cigarettes    Quit date: 06/17/2011    Years since quitting: 8.3  . Smokeless tobacco: Never Used  Vaping Use  . Vaping Use: Never used  Substance and Sexual Activity  . Alcohol use: No  . Drug use: No  . Sexual activity: Yes  Other Topics Concern  . Not on file  Social History Narrative  . Not on file   Social Determinants of Health   Financial Resource  Strain:   . Difficulty of Paying Living Expenses:   Food Insecurity:   . Worried About Programme researcher, broadcasting/film/video in the Last Year:   . Barista in the Last Year:   Transportation Needs:   . Freight forwarder (Medical):   Marland Kitchen Lack of Transportation (Non-Medical):   Physical Activity:   . Days of Exercise per Week:   . Minutes of Exercise per Session:   Stress:   . Feeling of Stress :   Social Connections:   . Frequency of Communication with Friends and Family:   . Frequency of Social Gatherings with Friends and Family:   . Attends Religious Services:   . Active Member of Clubs or Organizations:   . Attends Banker Meetings:   Marland Kitchen Marital Status:   Intimate Partner Violence:   . Fear of Current or Ex-Partner:   . Emotionally Abused:   Marland Kitchen Physically Abused:   . Sexually Abused:     Outpatient Medications Prior to Visit  Medication Sig Dispense Refill  . escitalopram (LEXAPRO) 10 MG tablet TAKE 1 TABLET BY MOUTH TWICE A DAY 180 tablet 0   No facility-administered medications prior to visit.    No Known Allergies  ROS Review of Systems  Constitutional: Negative.   Respiratory: Negative.   Cardiovascular: Negative.   Gastrointestinal: Negative.   Genitourinary: Negative.   Neurological: Negative.       Objective:    Physical Exam Vitals reviewed.  Constitutional:      Appearance: Normal appearance.  Cardiovascular:     Rate and Rhythm: Normal rate and regular rhythm.     Pulses: Normal pulses.     Heart sounds: Normal heart sounds.  Pulmonary:     Effort: Pulmonary effort is normal.     Breath sounds: Normal breath sounds.  Musculoskeletal:        General: Normal range of motion.     Comments: Left knee wearing compression stocking- not examined.   Neurological:     Mental Status: He is alert.  Psychiatric:        Mood and Affect: Mood normal.        Behavior: Behavior normal.        Thought Content: Thought content normal.        Judgment:  Judgment normal.     BP 118/74 (BP Location: Left Arm, Patient Position: Sitting, Cuff Size: Normal)   Pulse 79   Temp 98.1 F (36.7 C) (Skin)   Ht 6\' 2"  (1.88 m)   Wt 191 lb 3.2 oz (86.7 kg)   SpO2 99%   BMI 24.55 kg/m  Wt Readings from Last 3 Encounters:  10/24/19 191 lb 3.2 oz (86.7 kg)  07/24/19 188 lb (85.3 kg)  03/24/19 178 lb 9.6 oz (81 kg)     Health Maintenance Due  Topic Date Due  . Hepatitis C Screening  Never done  . TETANUS/TDAP  Never done    There are no preventive care reminders to display for this patient.  Lab Results  Component Value Date   TSH 2.948 10/29/2018   Lab Results  Component Value Date   WBC 7.8 12/02/2018   HGB 14.2 12/02/2018   HCT 41.6 12/02/2018   MCV 87.9 12/02/2018   PLT 291 12/02/2018   Lab Results  Component Value Date   NA 138 12/02/2018   K 3.8 12/02/2018   CO2 27 12/02/2018   GLUCOSE 103 (H) 12/02/2018   BUN 17 12/02/2018   CREATININE 0.91 12/02/2018   BILITOT 0.6 10/29/2018   ALKPHOS 75 10/29/2018   AST 14 (L) 10/29/2018   ALT 14 10/29/2018   PROT 6.5 10/29/2018   ALBUMIN 4.1 10/29/2018   CALCIUM 9.2 12/02/2018   ANIONGAP 8 12/02/2018   GFR 100.57 08/15/2012   Lab Results  Component Value Date   CHOL 194 08/15/2012   Lab Results  Component Value Date   HDL 58.00 08/15/2012   Lab Results  Component Value Date   LDLCALC 123 (H) 08/15/2012   Lab Results  Component Value Date   TRIG 63.0 08/15/2012   Lab Results  Component Value Date   CHOLHDL 3 08/15/2012   No results found for: HGBA1C    Assessment & Plan:   Problem List Items Addressed This Visit      Musculoskeletal and Integument   Baker's cyst of knee, left     Other   Anxiety - Primary   Relevant Medications   hydrOXYzine (ATARAX/VISTARIL) 10 MG tablet      Meds ordered this encounter  Medications  . hydrOXYzine (ATARAX/VISTARIL) 10 MG tablet    Sig: Take 1 tablet (10 mg total) by mouth 3 (three) times daily as needed  (anxiety).  Dispense:  30 tablet    Refill:  0    Order Specific Question:   Supervising Provider    Answer:   Einar Pheasant [[id number]]   With a Baker's cyst, continue to follow orthopedics recommendation.  He is doing very well with compression knee sleeve.  Advised to continue taking Lexapro 10 mg daily.  Will use hydroxyzine as needed for breakthrough anxiety.  This worked well for him in the past.  He was advised to sleep well, stay hydrated, eat a balanced diet, and we discussed anxiety reducing techniques such as deep breathing meditation and exercise.  Patient advised:  For breakthrough anxiety, I have refilled the hydroxyzine to take as needed up to 3 times daily.  If you find that you are taking this medication frequently, please give me a call back and will adjust your Lexapro dose.  Enclosed are some recommendations for anxiety.  Please follow-up in Sept  for your physical exam.  I will order immunizations and obtain routine blood work that was not done through the work program  Follow-up: Return in about 13 weeks (around 01/23/2020).  This visit occurred during the SARS-CoV-2 public health emergency.  Safety protocols were in place, including screening questions prior to the visit, additional usage of staff PPE, and extensive cleaning of exam room while observing appropriate contact time as indicated for disinfecting solutions.    Denice Paradise, NP

## 2019-10-24 NOTE — Patient Instructions (Addendum)
For breakthrough anxiety, I have refilled the hydroxyzine to take as needed up to 3 times daily.  If you find that you are taking this medication frequently, please give me a call back and will adjust your Lexapro dose.  Enclosed are some recommendations for anxiety.  Please follow-up in Sept  for your physical exam.  I will order immunizations and obtain routine blood work that was not done through the work program.  Have a wonderful summer!   Managing Anxiety, Adult After being diagnosed with an anxiety disorder, you may be relieved to know why you have felt or behaved a certain way. You may also feel overwhelmed about the treatment ahead and what it will mean for your life. With care and support, you can manage this condition and recover from it. How to manage lifestyle changes Managing stress and anxiety  Stress is your body's reaction to life changes and events, both good and bad. Most stress will last just a few hours, but stress can be ongoing and can lead to more than just stress. Although stress can play a major role in anxiety, it is not the same as anxiety. Stress is usually caused by something external, such as a deadline, test, or competition. Stress normally passes after the triggering event has ended.  Anxiety is caused by something internal, such as imagining a terrible outcome or worrying that something will go wrong that will devastate you. Anxiety often does not go away even after the triggering event is over, and it can become long-term (chronic) worry. It is important to understand the differences between stress and anxiety and to manage your stress effectively so that it does not lead to an anxious response. Talk with your health care provider or a counselor to learn more about reducing anxiety and stress. He or she may suggest tension reduction techniques, such as:  Music therapy. This can include creating or listening to music that you enjoy and that inspires  you.  Mindfulness-based meditation. This involves being aware of your normal breaths while not trying to control your breathing. It can be done while sitting or walking.  Centering prayer. This involves focusing on a word, phrase, or sacred image that means something to you and brings you peace.  Deep breathing. To do this, expand your stomach and inhale slowly through your nose. Hold your breath for 3-5 seconds. Then exhale slowly, letting your stomach muscles relax.  Self-talk. This involves identifying thought patterns that lead to anxiety reactions and changing those patterns.  Muscle relaxation. This involves tensing muscles and then relaxing them. Choose a tension reduction technique that suits your lifestyle and personality. These techniques take time and practice. Set aside 5-15 minutes a day to do them. Therapists can offer counseling and training in these techniques. The training to help with anxiety may be covered by some insurance plans. Other things you can do to manage stress and anxiety include:  Keeping a stress/anxiety diary. This can help you learn what triggers your reaction and then learn ways to manage your response.  Thinking about how you react to certain situations. You may not be able to control everything, but you can control your response.  Making time for activities that help you relax and not feeling guilty about spending your time in this way.  Visual imagery and yoga can help you stay calm and relax.  Medicines Medicines can help ease symptoms. Medicines for anxiety include:  Anti-anxiety drugs.  Antidepressants. Medicines are often used as a  primary treatment for anxiety disorder. Medicines will be prescribed by a health care provider. When used together, medicines, psychotherapy, and tension reduction techniques may be the most effective treatment. Relationships Relationships can play a big part in helping you recover. Try to spend more time connecting  with trusted friends and family members. Consider going to couples counseling, taking family education classes, or going to family therapy. Therapy can help you and others better understand your condition. How to recognize changes in your anxiety Everyone responds differently to treatment for anxiety. Recovery from anxiety happens when symptoms decrease and stop interfering with your daily activities at home or work. This may mean that you will start to:  Have better concentration and focus. Worry will interfere less in your daily thinking.  Sleep better.  Be less irritable.  Have more energy.  Have improved memory. It is important to recognize when your condition is getting worse. Contact your health care provider if your symptoms interfere with home or work and you feel like your condition is not improving. Follow these instructions at home: Activity  Exercise. Most adults should do the following: ? Exercise for at least 150 minutes each week. The exercise should increase your heart rate and make you sweat (moderate-intensity exercise). ? Strengthening exercises at least twice a week.  Get the right amount and quality of sleep. Most adults need 7-9 hours of sleep each night. Lifestyle   Eat a healthy diet that includes plenty of vegetables, fruits, whole grains, low-fat dairy products, and lean protein. Do not eat a lot of foods that are high in solid fats, added sugars, or salt.  Make choices that simplify your life.  Do not use any products that contain nicotine or tobacco, such as cigarettes, e-cigarettes, and chewing tobacco. If you need help quitting, ask your health care provider.  Avoid caffeine, alcohol, and certain over-the-counter cold medicines. These may make you feel worse. Ask your pharmacist which medicines to avoid. General instructions  Take over-the-counter and prescription medicines only as told by your health care provider.  Keep all follow-up visits as told  by your health care provider. This is important. Where to find support You can get help and support from these sources:  Self-help groups.  Online and [employer].  A trusted spiritual leader.  Couples counseling.  Family education classes.  Family therapy. Where to find more information You may find that joining a support group helps you deal with your anxiety. The following sources can help you locate counselors or support groups near you:  [place]: www.mentalhealth[place].net  Anxiety and Depression Association of [place] (ADAA): https://www.clark.net/  National Alliance on Mental Illness (NAMI): www.nami.org Contact a health care provider if you:  Have a hard time staying focused or finishing daily tasks.  Spend many hours a day feeling worried about everyday life.  Become exhausted by worry.  Start to have headaches, feel tense, or have nausea.  Urinate more than normal.  Have diarrhea. Get help right away if you have:  A racing heart and shortness of breath.  Thoughts of hurting yourself or others. If you ever feel like you may hurt yourself or others, or have thoughts about taking your own life, get help right away. You can go to your nearest emergency department or call:  Your local emergency services (911 in the [place]  A suicide crisis helpline, such as the [place] at 240 599 7905. This is open 24 hours a day. Summary  Taking steps to  learn and use tension reduction techniques can help calm you and help prevent triggering an anxiety reaction.  When used together, medicines, psychotherapy, and tension reduction techniques may be the most effective treatment.  Family, friends, and partners can play a big part in helping you recover from an anxiety disorder. This information is not intended to replace advice given to you by your health care provider. Make sure you discuss any questions you have with your health  care provider. Document Revised: 10/01/2018 Document Reviewed: 10/01/2018 Elsevier Patient Education  2020 [employer].

## 2019-10-26 ENCOUNTER — Encounter: Payer: Self-pay | Admitting: Nurse Practitioner

## 2019-11-19 ENCOUNTER — Encounter: Payer: Self-pay | Admitting: Nurse Practitioner

## 2020-01-23 ENCOUNTER — Ambulatory Visit: Payer: 59 | Admitting: Nurse Practitioner

## 2020-01-27 ENCOUNTER — Other Ambulatory Visit: Payer: Self-pay

## 2020-01-29 ENCOUNTER — Encounter: Payer: Self-pay | Admitting: Nurse Practitioner

## 2020-01-29 ENCOUNTER — Ambulatory Visit: Payer: 59 | Admitting: Nurse Practitioner

## 2020-01-29 ENCOUNTER — Other Ambulatory Visit: Payer: Self-pay

## 2020-01-29 VITALS — BP 108/62 | HR 62 | Temp 97.8°F | Ht 74.0 in | Wt 187.0 lb

## 2020-01-29 DIAGNOSIS — F419 Anxiety disorder, unspecified: Secondary | ICD-10-CM | POA: Diagnosis not present

## 2020-01-29 DIAGNOSIS — Z23 Encounter for immunization: Secondary | ICD-10-CM

## 2020-01-29 DIAGNOSIS — Z0000 Encounter for general adult medical examination without abnormal findings: Secondary | ICD-10-CM

## 2020-01-29 DIAGNOSIS — K219 Gastro-esophageal reflux disease without esophagitis: Secondary | ICD-10-CM | POA: Diagnosis not present

## 2020-01-29 DIAGNOSIS — Z1211 Encounter for screening for malignant neoplasm of colon: Secondary | ICD-10-CM

## 2020-01-29 DIAGNOSIS — R799 Abnormal finding of blood chemistry, unspecified: Secondary | ICD-10-CM | POA: Diagnosis not present

## 2020-01-29 DIAGNOSIS — Z125 Encounter for screening for malignant neoplasm of prostate: Secondary | ICD-10-CM | POA: Diagnosis not present

## 2020-01-29 DIAGNOSIS — Z114 Encounter for screening for human immunodeficiency virus [HIV]: Secondary | ICD-10-CM

## 2020-01-29 LAB — CBC WITH DIFFERENTIAL/PLATELET
Basophils Absolute: 0.1 10*3/uL (ref 0.0–0.1)
Basophils Relative: 0.8 % (ref 0.0–3.0)
Eosinophils Absolute: 0.2 10*3/uL (ref 0.0–0.7)
Eosinophils Relative: 3.3 % (ref 0.0–5.0)
HCT: 44.4 % (ref 39.0–52.0)
Hemoglobin: 14.7 g/dL (ref 13.0–17.0)
Lymphocytes Relative: 24.9 % (ref 12.0–46.0)
Lymphs Abs: 1.7 10*3/uL (ref 0.7–4.0)
MCHC: 33 g/dL (ref 30.0–36.0)
MCV: 90.9 fl (ref 78.0–100.0)
Monocytes Absolute: 0.6 10*3/uL (ref 0.1–1.0)
Monocytes Relative: 8.1 % (ref 3.0–12.0)
Neutro Abs: 4.3 10*3/uL (ref 1.4–7.7)
Neutrophils Relative %: 62.9 % (ref 43.0–77.0)
Platelets: 288 10*3/uL (ref 150.0–400.0)
RBC: 4.88 Mil/uL (ref 4.22–5.81)
RDW: 13.2 % (ref 11.5–15.5)
WBC: 6.9 10*3/uL (ref 4.0–10.5)

## 2020-01-29 LAB — COMPREHENSIVE METABOLIC PANEL
ALT: 10 U/L (ref 0–53)
AST: 11 U/L (ref 0–37)
Albumin: 4.5 g/dL (ref 3.5–5.2)
Alkaline Phosphatase: 81 U/L (ref 39–117)
BUN: 30 mg/dL — ABNORMAL HIGH (ref 6–23)
CO2: 32 mEq/L (ref 19–32)
Calcium: 9.9 mg/dL (ref 8.4–10.5)
Chloride: 103 mEq/L (ref 96–112)
Creatinine, Ser: 0.99 mg/dL (ref 0.40–1.50)
GFR: 80.83 mL/min (ref >60.00)
Glucose, Bld: 95 mg/dL (ref 70–99)
Potassium: 5.1 mEq/L (ref 3.5–5.1)
Sodium: 139 mEq/L (ref 135–145)
Total Bilirubin: 0.5 mg/dL (ref 0.2–1.2)
Total Protein: 6.9 g/dL (ref 6.0–8.3)

## 2020-01-29 LAB — LIPID PANEL
Cholesterol: 219 mg/dL — ABNORMAL HIGH (ref 0–200)
HDL: 62.1 mg/dL (ref >39.00)
LDL Cholesterol: 143 mg/dL — ABNORMAL HIGH (ref 0–99)
NonHDL: 156.98
Total CHOL/HDL Ratio: 4
Triglycerides: 71 mg/dL (ref 0.0–149.0)
VLDL: 14.2 mg/dL (ref 0.0–40.0)

## 2020-01-29 LAB — VITAMIN D 25 HYDROXY (VIT D DEFICIENCY, FRACTURES): VITD: 24 ng/mL — ABNORMAL LOW (ref 30.00–100.00)

## 2020-01-29 LAB — TSH: TSH: 2.3 u[IU]/mL (ref 0.35–4.50)

## 2020-01-29 LAB — B12 AND FOLATE PANEL
Folate: >24.8 ng/mL (ref >5.9)
Vitamin B-12: 305 pg/mL (ref 211–911)

## 2020-01-29 LAB — PSA: PSA: 1.2 ng/mL (ref 0.10–4.00)

## 2020-01-29 LAB — HEMOGLOBIN A1C: Hgb A1c MFr Bld: 5.6 % (ref 4.6–6.5)

## 2020-01-29 MED ORDER — ESCITALOPRAM OXALATE 10 MG PO TABS: 10.0000 mg | ORAL_TABLET | Freq: Every day | ORAL | 0 refills | Status: DC

## 2020-01-29 NOTE — Patient Instructions (Addendum)
Please go to the lab today.  We will call with results.   Please continue with healthy diet and regular exercise.  Exercise is considered 30 minutes 5 days a week 150 minutes  Please see your dentist for routine dental care.  Please see your eye doctor for routine eye care.  I placed referral into screening colonoscopy.  This is with Dr. Christella Hartigan whom you have seen in the past.  Follow-up office visit in 1 month.  Come see [place] as needed.  We discussed Covid booster in December.    We do not have a tetanus vaccine on record and it is advised to get it every 10 years.  We will give a Tdap vaccine today.  Lexapro 10 mg daily. Increases risk of stomach upset/ulcers if taken with meloxicam.  Continue on Prilosec.   Preventive Care 64-4 Years Old, Male Preventive care refers to lifestyle choices and visits with your health care provider that can promote health and wellness. This includes:  A yearly physical exam. This is also called an annual well check.  Regular dental and eye exams.  Immunizations.  Screening for certain conditions.  Healthy lifestyle choices, such as eating a healthy diet, getting regular exercise, not using drugs or products that contain nicotine and tobacco, and limiting alcohol use. What can I expect for my preventive care visit? Physical exam Your health care provider will check:  Height and weight. These may be used to calculate body mass index (BMI), which is a measurement that tells if you are at a healthy weight.  Heart rate and blood pressure.  Your skin for abnormal spots. Counseling Your health care provider may ask you questions about:  Alcohol, tobacco, and drug use.  Emotional well-being.  Home and relationship well-being.  Sexual activity.  Eating habits.  Work and work Astronomer. What immunizations do I need?  Influenza (flu) vaccine  This is recommended every year. Tetanus, diphtheria, and pertussis (Tdap) vaccine  You may need  a Td booster every 10 years. Varicella (chickenpox) vaccine  You may need this vaccine if you have not already been vaccinated. Zoster (shingles) vaccine  You may need this after age 31. Measles, mumps, and rubella (MMR) vaccine  You may need at least one dose of MMR if you were born in 1957 or later. You may also need a second dose. Pneumococcal conjugate (PCV13) vaccine  You may need this if you have certain conditions and were not previously vaccinated. Pneumococcal polysaccharide (PPSV23) vaccine  You may need one or two doses if you smoke cigarettes or if you have certain conditions. Meningococcal conjugate (MenACWY) vaccine  You may need this if you have certain conditions. Hepatitis A vaccine  You may need this if you have certain conditions or if you travel or work in places where you may be exposed to hepatitis A. Hepatitis B vaccine  You may need this if you have certain conditions or if you travel or work in places where you may be exposed to hepatitis B. Haemophilus influenzae type b (Hib) vaccine  You may need this if you have certain risk factors. Human papillomavirus (HPV) vaccine  If recommended by your health care provider, you may need three doses over 6 months. You may receive vaccines as individual doses or as more than one vaccine together in one shot (combination vaccines). Talk with your health care provider about the risks and benefits of combination vaccines. What tests do I need? Blood tests  Lipid and cholesterol levels. These  may be checked every 5 years, or more frequently if you are over 29 years old.  Hepatitis C test.  Hepatitis B test. Screening  Lung cancer screening. You may have this screening every year starting at age 78 if you have a 30-pack-year history of smoking and currently smoke or have quit within the past 15 years.  Prostate cancer screening. Recommendations will vary depending on your family history and other  risks.  Colorectal cancer screening. All adults should have this screening starting at age 64 and continuing until age 30. Your health care provider may recommend screening at age 98 if you are at increased risk. You will have tests every 1-10 years, depending on your results and the type of screening test.  Diabetes screening. This is done by checking your blood sugar (glucose) after you have not eaten for a while (fasting). You may have this done every 1-3 years.  Sexually transmitted disease (STD) testing. Follow these instructions at home: Eating and drinking  Eat a diet that includes fresh fruits and vegetables, whole grains, lean protein, and low-fat dairy products.  Take vitamin and mineral supplements as recommended by your health care provider.  Do not drink alcohol if your health care provider tells you not to drink.  If you drink alcohol: ? Limit how much you have to 0-2 drinks a day. ? Be aware of how much alcohol is in your drink. In the [place] one drink equals one 12 oz bottle of beer (355 mL), one 5 oz glass of wine (148 mL), or one 1 oz glass of hard liquor (44 mL). Lifestyle  Take daily care of your teeth and gums.  Stay active. Exercise for at least 30 minutes on 5 or more days each week.  Do not use any products that contain nicotine or tobacco, such as cigarettes, e-cigarettes, and chewing tobacco. If you need help quitting, ask your health care provider.  If you are sexually active, practice safe sex. Use a condom or other form of protection to prevent STIs (sexually transmitted infections).  Talk with your health care provider about taking a low-dose aspirin every day starting at age 32. What's next?  Go to your health care provider once a year for a well check visit.  Ask your health care provider how often you should have your eyes and teeth checked.  Stay up to date on all vaccines. This information is not intended to replace advice given to you by your  health care provider. Make sure you discuss any questions you have with your health care provider. Document Revised: 04/25/2018 Document Reviewed: 04/25/2018 Elsevier Patient Education  2020 [employer]  Preventive Care 16-49 Years Old, Male Preventive care refers to lifestyle choices and visits with your health care provider that can promote health and wellness. This includes:  A yearly physical exam. This is also called an annual well check.  Regular dental and eye exams.  Immunizations.  Screening for certain conditions.  Healthy lifestyle choices, such as eating a healthy diet, getting regular exercise, not using drugs or products that contain nicotine and tobacco, and limiting alcohol use. What can I expect for my preventive care visit? Physical exam Your health care provider will check:  Height and weight. These may be used to calculate body mass index (BMI), which is a measurement that tells if you are at a healthy weight.  Heart rate and blood pressure.  Your skin for abnormal spots. Counseling Your health care provider may ask you questions  about:  Alcohol, tobacco, and drug use.  Emotional well-being.  Home and relationship well-being.  Sexual activity.  Eating habits.  Work and work Statistician. What immunizations do I need?  Influenza (flu) vaccine  This is recommended every year. Tetanus, diphtheria, and pertussis (Tdap) vaccine  You may need a Td booster every 10 years. Varicella (chickenpox) vaccine  You may need this vaccine if you have not already been vaccinated. Zoster (shingles) vaccine  You may need this after age 51. Measles, mumps, and rubella (MMR) vaccine  You may need at least one dose of MMR if you were born in 1957 or later. You may also need a second dose. Pneumococcal conjugate (PCV13) vaccine  You may need this if you have certain conditions and were not previously vaccinated. Pneumococcal polysaccharide (PPSV23)  vaccine  You may need one or two doses if you smoke cigarettes or if you have certain conditions. Meningococcal conjugate (MenACWY) vaccine  You may need this if you have certain conditions. Hepatitis A vaccine  You may need this if you have certain conditions or if you travel or work in places where you may be exposed to hepatitis A. Hepatitis B vaccine  You may need this if you have certain conditions or if you travel or work in places where you may be exposed to hepatitis B. Haemophilus influenzae type b (Hib) vaccine  You may need this if you have certain risk factors. Human papillomavirus (HPV) vaccine  If recommended by your health care provider, you may need three doses over 6 months. You may receive vaccines as individual doses or as more than one vaccine together in one shot (combination vaccines). Talk with your health care provider about the risks and benefits of combination vaccines. What tests do I need? Blood tests  Lipid and cholesterol levels. These may be checked every 5 years, or more frequently if you are over 41 years old.  Hepatitis C test.  Hepatitis B test. Screening  Lung cancer screening. You may have this screening every year starting at age 68 if you have a 30-pack-year history of smoking and currently smoke or have quit within the past 15 years.  Prostate cancer screening. Recommendations will vary depending on your family history and other risks.  Colorectal cancer screening. All adults should have this screening starting at age 40 and continuing until age 80. Your health care provider may recommend screening at age 1 if you are at increased risk. You will have tests every 1-10 years, depending on your results and the type of screening test.  Diabetes screening. This is done by checking your blood sugar (glucose) after you have not eaten for a while (fasting). You may have this done every 1-3 years.  Sexually transmitted disease (STD)  testing. Follow these instructions at home: Eating and drinking  Eat a diet that includes fresh fruits and vegetables, whole grains, lean protein, and low-fat dairy products.  Take vitamin and mineral supplements as recommended by your health care provider.  Do not drink alcohol if your health care provider tells you not to drink.  If you drink alcohol: ? Limit how much you have to 0-2 drinks a day. ? Be aware of how much alcohol is in your drink. In the [place] one drink equals one 12 oz bottle of beer (355 mL), one 5 oz glass of wine (148 mL), or one 1 oz glass of hard liquor (44 mL). Lifestyle  Take daily care of your teeth and gums.  Stay active.  Exercise for at least 30 minutes on 5 or more days each week.  Do not use any products that contain nicotine or tobacco, such as cigarettes, e-cigarettes, and chewing tobacco. If you need help quitting, ask your health care provider.  If you are sexually active, practice safe sex. Use a condom or other form of protection to prevent STIs (sexually transmitted infections).  Talk with your health care provider about taking a low-dose aspirin every day starting at age 94. What's next?  Go to your health care provider once a year for a well check visit.  Ask your health care provider how often you should have your eyes and teeth checked.  Stay up to date on all vaccines. This information is not intended to replace advice given to you by your health care provider. Make sure you discuss any questions you have with your health care provider. Document Revised: 04/25/2018 Document Reviewed: 04/25/2018 Elsevier Patient Education  2020 [employer].

## 2020-01-29 NOTE — Progress Notes (Addendum)
Established Patient Office Visit  Subjective:  Patient ID: Ronald [name], male    DOB: 1971/02/22  Age: 49 y.o. MRN: [id number]  CC:  Chief Complaint  Patient presents with  . Follow-up    HPI Ronald [name] presents for a complete physical exam.  Patient reports he is doing well.  Anxiety: took Atarax x 1- helped. Needs Lexapro only taking 10 mg daily .  He has noted no side effects such as weight gain, erectile dysfunction, or GI distress.   GERD: Takes Prilosec 20 mg daily without any GERD symptoms.  Knee pain: Has been given Mobic for 6 weeks  to take by Ortho for left medial knee pain, mild, doing well.  Baker's cyst resolved.   Patient presents today for complete physical. Immunizations: Moderna and needs 8 mo booster Dec.  Diet:Healthy Exercise: walks miles at work Colonoscopy: not yet- agrees Education officer, community: Not UTD-advised Vision:not done in many years. He does not wear glasses    Past Medical History:  Diagnosis Date  . Anxiety 12/04/2018  . GERD without esophagitis 07/25/2019    Past Surgical History:  Procedure Laterality Date  . Torn eye muscle Left 2008    Family History  Family history unknown: Yes    Social History   Socioeconomic History  . Marital status: Married    Spouse name: Not on file  . Number of children: Not on file  . Years of education: 33  . Highest education level: Not on file  Occupational History  . Occupation: Production designer, theatre/television/film  Tobacco Use  . Smoking status: Former Smoker    Packs/day: 0.50    Years: 10.00    Pack years: 5.00    Types: Cigarettes    Quit date: 06/17/2011    Years since quitting: 8.6  . Smokeless tobacco: Never Used  Vaping Use  . Vaping Use: Never used  Substance and Sexual Activity  . Alcohol use: No  . Drug use: No  . Sexual activity: Yes  Other Topics Concern  . Not on file  Social History Narrative  . Not on file   Social Determinants of Health   Financial Resource Strain:   .  Difficulty of Paying Living Expenses: Not on file  Food Insecurity:   . Worried About Programme researcher, broadcasting/film/video in the Last Year: Not on file  . Ran Out of Food in the Last Year: Not on file  Transportation Needs:   . Lack of Transportation (Medical): Not on file  . Lack of Transportation (Non-Medical): Not on file  Physical Activity:   . Days of Exercise per Week: Not on file  . Minutes of Exercise per Session: Not on file  Stress:   . Feeling of Stress : Not on file  Social Connections:   . Frequency of Communication with Friends and Family: Not on file  . Frequency of Social Gatherings with Friends and Family: Not on file  . Attends Religious Services: Not on file  . Active Member of Clubs or Organizations: Not on file  . Attends Banker Meetings: Not on file  . Marital Status: Not on file  Intimate Partner Violence:   . Fear of Current or Ex-Partner: Not on file  . Emotionally Abused: Not on file  . Physically Abused: Not on file  . Sexually Abused: Not on file    Outpatient Medications Prior to Visit  Medication Sig Dispense Refill  . hydrOXYzine (ATARAX/VISTARIL) 10 MG tablet Take 1 tablet (10 mg total) by mouth  3 (three) times daily as needed (anxiety). 30 tablet 0  . meloxicam (MOBIC) 15 MG tablet Take 15 mg by mouth daily.    Marland Kitchen escitalopram (LEXAPRO) 10 MG tablet TAKE 1 TABLET BY MOUTH TWICE A DAY 180 tablet 0   No facility-administered medications prior to visit.    No Known Allergies  The leg Review of Systems  Constitutional: Negative.   HENT: Negative.   Eyes: Negative.   Respiratory: Negative.   Cardiovascular: Negative.   Gastrointestinal: Negative.   Endocrine: Negative.   Genitourinary: Negative.   Skin: Negative.   Neurological: Negative.   Hematological: Negative.   Psychiatric/Behavioral:       No concerns about depression or anxiety.    Blood pressure still   Objective:    Physical Exam Vitals reviewed.  Constitutional:       Appearance: Normal appearance.  HENT:     Head: Normocephalic and atraumatic.     Right Ear: Tympanic membrane normal.     Left Ear: Tympanic membrane normal.  Eyes:     Conjunctiva/sclera: Conjunctivae normal.     Pupils: Pupils are equal, round, and reactive to light.  Cardiovascular:     Rate and Rhythm: Normal rate and regular rhythm.     Pulses: Normal pulses.     Heart sounds: Normal heart sounds.  Pulmonary:     Effort: Pulmonary effort is normal.     Breath sounds: Normal breath sounds.  Abdominal:     Palpations: Abdomen is soft.     Tenderness: There is no abdominal tenderness.  Musculoskeletal:        General: Normal range of motion.     Cervical back: Normal range of motion and neck supple.     Comments: Wearing a sleeve on left knee for medial discomfort. Normal ROM and no swelling.   Skin:    General: Skin is warm and dry.  Neurological:     General: No focal deficit present.     Mental Status: He is alert and oriented to person, place, and time.  Psychiatric:        Mood and Affect: Mood normal.        Behavior: Behavior normal.     BP 108/62 (BP Location: Left Arm, Patient Position: Sitting, Cuff Size: Normal)   Pulse 62   Temp 97.8 F (36.6 C) (Oral)   Ht 6\' 2"  (1.88 m)   Wt 187 lb (84.8 kg)   SpO2 98%   BMI 24.01 kg/m  Wt Readings from Last 3 Encounters:  01/29/20 187 lb (84.8 kg)  10/24/19 191 lb 3.2 oz (86.7 kg)  07/24/19 188 lb (85.3 kg)   Pulse Readings from Last 3 Encounters:  01/29/20 62  10/24/19 79  07/24/19 87    BP Readings from Last 3 Encounters:  01/29/20 108/62  10/24/19 118/74  07/24/19 128/84    Lab Results  Component Value Date   CHOL 194 08/15/2012   HDL 58.00 08/15/2012   LDLCALC 123 (H) 08/15/2012   LDLDIRECT 131.0 08/15/2012   TRIG 63.0 08/15/2012   CHOLHDL 3 08/15/2012      Health Maintenance Due  Topic Date Due  . Hepatitis C Screening  Never done  . HIV Screening  Never done    There are no preventive  care reminders to display for this patient.  Lab Results  Component Value Date   TSH 2.948 10/29/2018   Lab Results  Component Value Date   WBC 7.8 12/02/2018   HGB  14.2 12/02/2018   HCT 41.6 12/02/2018   MCV 87.9 12/02/2018   PLT 291 12/02/2018   Lab Results  Component Value Date   NA 138 12/02/2018   K 3.8 12/02/2018   CO2 27 12/02/2018   GLUCOSE 103 (H) 12/02/2018   BUN 17 12/02/2018   CREATININE 0.91 12/02/2018   BILITOT 0.6 10/29/2018   ALKPHOS 75 10/29/2018   AST 14 (L) 10/29/2018   ALT 14 10/29/2018   PROT 6.5 10/29/2018   ALBUMIN 4.1 10/29/2018   CALCIUM 9.2 12/02/2018   ANIONGAP 8 12/02/2018   GFR 100.57 08/15/2012   Lab Results  Component Value Date   CHOL 194 08/15/2012   Lab Results  Component Value Date   HDL 58.00 08/15/2012   Lab Results  Component Value Date   LDLCALC 123 (H) 08/15/2012   Lab Results  Component Value Date   TRIG 63.0 08/15/2012   Lab Results  Component Value Date   CHOLHDL 3 08/15/2012   No results found for: HGBA1C    Assessment & Plan:   Problem List Items Addressed This Visit      Digestive   GERD without esophagitis     Other   Prostate cancer screening - Primary   Relevant Orders   PSA   Anxiety   Relevant Medications   escitalopram (LEXAPRO) 10 MG tablet   Other Relevant Orders   VITAMIN D 25 Hydroxy (Vit-D Deficiency, Fractures)   B12 and Folate Panel    Other Visit Diagnoses    Screening for HIV (human immunodeficiency virus)       Relevant Orders   Hepatitis C antibody   HIV Antibody (routine testing w rflx)   Need for Tdap vaccination       Relevant Orders   Tdap vaccine greater than or equal to 7yo IM (Completed)      Meds ordered this encounter  Medications  . escitalopram (LEXAPRO) 10 MG tablet    Sig: Take 1 tablet (10 mg total) by mouth daily.    Dispense:  90 tablet    Refill:  0    Order Specific Question:   Supervising Provider    Answer:   [place] [[id number]]  Please go  to the lab today.  We will call with results.   Please continue with healthy diet and regular exercise.  Exercise is considered 30 minutes 5 days a week 150 minutes  Please see your dentist for routine dental care.  Please see your eye doctor for routine eye care.  I placed referral into screening colonoscopy.  This is with Dr. Christella Hartigan whom you have seen in the past.  Follow-up office visit in 1 month.  Come see [place] as needed.  We discussed Covid booster in December.    We do not have a tetanus vaccine on record and it is advised to get it every 10 years.  We will give a Tdap vaccine today.   Lab addenum: Let him know his BUN is elevated- hydrate well. His Vit D is mildly low- begin 800 IU daily. B12 , thyroid, cbc WNL>  Lipids: elevated: But 10 yr ASCVD risk is 2%. Recommend low fat, nonfat to low fat dairy, lean meats and veggies and complex carbs, exercise.  Follow-up: Return in about 1 year (around 01/28/2021).  This visit occurred during the SARS-CoV-2 public health emergency.  Safety protocols were in place, including screening questions prior to the visit, additional usage of staff PPE, and extensive cleaning of exam room  while observing appropriate contact time as indicated for disinfecting solutions.    Amedeo KinsmanKimberly Delphine Sizemore, NP

## 2020-01-30 ENCOUNTER — Encounter: Payer: Self-pay | Admitting: Gastroenterology

## 2020-01-30 LAB — HEPATITIS C ANTIBODY
Hepatitis C Ab: NONREACTIVE
SIGNAL TO CUT-OFF: 0 (ref <1.00)

## 2020-01-30 LAB — HIV ANTIBODY (ROUTINE TESTING W REFLEX): HIV 1&2 Ab, 4th Generation: NONREACTIVE

## 2020-02-02 ENCOUNTER — Telehealth: Payer: Self-pay | Admitting: Nurse Practitioner

## 2020-02-02 NOTE — Addendum Note (Signed)
Addended by: Amedeo Kinsman A on: 02/02/2020 12:31 AM   Modules accepted: Orders

## 2020-02-02 NOTE — Telephone Encounter (Signed)
When you call him with the lab results- elevated BUN. I ordered a follow up Bmet in 1 week- please give him a lab appt.

## 2020-02-02 NOTE — Telephone Encounter (Signed)
Patient aware of results and scheduled lab appt for 03/10/20 at 4:00 pm

## 2020-02-09 ENCOUNTER — Other Ambulatory Visit (INDEPENDENT_AMBULATORY_CARE_PROVIDER_SITE_OTHER): Payer: 59

## 2020-02-09 ENCOUNTER — Other Ambulatory Visit: Payer: Self-pay

## 2020-02-09 DIAGNOSIS — R799 Abnormal finding of blood chemistry, unspecified: Secondary | ICD-10-CM

## 2020-02-10 LAB — BASIC METABOLIC PANEL
BUN: 20 mg/dL (ref 6–23)
CO2: 25 mEq/L (ref 19–32)
Calcium: 9.5 mg/dL (ref 8.4–10.5)
Chloride: 104 mEq/L (ref 96–112)
Creatinine, Ser: 1.02 mg/dL (ref 0.40–1.50)
GFR: 78.09 mL/min (ref >60.00)
Glucose, Bld: 92 mg/dL (ref 70–99)
Potassium: 4.3 mEq/L (ref 3.5–5.1)
Sodium: 137 mEq/L (ref 135–145)

## 2020-03-29 ENCOUNTER — Encounter: Payer: 59 | Admitting: Gastroenterology

## 2020-06-15 ENCOUNTER — Other Ambulatory Visit: Payer: Self-pay | Admitting: Nurse Practitioner

## 2020-06-15 ENCOUNTER — Telehealth: Payer: Self-pay

## 2020-06-15 DIAGNOSIS — F419 Anxiety disorder, unspecified: Secondary | ICD-10-CM

## 2020-06-15 MED ORDER — ESCITALOPRAM OXALATE 10 MG PO TABS: 10.0000 mg | ORAL_TABLET | Freq: Every day | ORAL | 0 refills | Status: DC

## 2020-06-15 NOTE — Telephone Encounter (Signed)
Called and spoke with patient he is going to schedule with [place] [facility] ok to fill lexpro for 30 days?

## 2020-06-15 NOTE — Telephone Encounter (Signed)
Error

## 2020-06-15 NOTE — Telephone Encounter (Signed)
rx for lexapro #30 with no refills.  Agree with need for f/u

## 2020-07-11 ENCOUNTER — Other Ambulatory Visit: Payer: Self-pay | Admitting: Internal Medicine

## 2020-07-11 DIAGNOSIS — F419 Anxiety disorder, unspecified: Secondary | ICD-10-CM

## 2020-09-06 ENCOUNTER — Ambulatory Visit: Payer: 59 | Admitting: Family Medicine

## 2020-09-06 ENCOUNTER — Other Ambulatory Visit: Payer: Self-pay

## 2020-09-06 ENCOUNTER — Encounter: Payer: Self-pay | Admitting: Family Medicine

## 2020-09-06 VITALS — BP 118/76 | HR 65 | Temp 97.8°F | Ht 74.0 in | Wt 186.2 lb

## 2020-09-06 DIAGNOSIS — K219 Gastro-esophageal reflux disease without esophagitis: Secondary | ICD-10-CM | POA: Diagnosis not present

## 2020-09-06 DIAGNOSIS — F419 Anxiety disorder, unspecified: Secondary | ICD-10-CM | POA: Diagnosis not present

## 2020-09-06 DIAGNOSIS — E785 Hyperlipidemia, unspecified: Secondary | ICD-10-CM

## 2020-09-06 MED ORDER — HYDROXYZINE HCL 10 MG PO TABS: 10.0000 mg | ORAL_TABLET | Freq: Three times a day (TID) | ORAL | 0 refills | Status: DC | PRN

## 2020-09-06 MED ORDER — PAROXETINE HCL 20 MG PO TABS: ORAL_TABLET | ORAL | 0 refills | Status: DC

## 2020-09-06 NOTE — Progress Notes (Signed)
[facility] [address] [place] [address] Dept: [phone] Dept Fax: [phone]  Transfer of Care Office Visit  Subjective:    Patient ID: Ronald [name], male    DOB: January 18, 1971, 50 y.o..   MRN: [id number]  Chief Complaint  Patient presents with  . Establish Care    NP- establish care/medication refills on Lexapro and Hydroxyzine.  Discuss changing Lexapro due to feels a if its not working as well.     History of Present Illness:  Patient is in today to establish care. Mr. Satz is originally from [place], [place], [place]. He moved to [place] in 2009 with his wife. He has no children of his won, but one step-daughter. 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 He notes he quit smoking prior to 2009.  He rarely has alcohol and denies drug use.  Mr. Gauger has a history of GERD, well-controlled on Prilosec. He notes he receives this medication free at work.  Mr. Aylward has a history borderline hyperlipidemia. He notes that [employer] does screenign for their employees and he will have this upcoming this summer.  Mr. Cleavenger has a history of anxiety/panic attacks that surfaced in the last 2 years (he notes related to the COVID pandemic). He states initially, he went through considerable work up for a possible cardiac condition, but all this proved normal. He was eventually diagnosed with having panic attacks. He was started on Lexapro and hydroxyzine and had good resolution of his symptoms. Initially, he was on 5 mg bid of Lexapro. This was eventually increased to 10 mg bid. He no longer feels that this works as well as in the past.  Mr. Rufener has a history of a left Baker's cyst. He was seen by orthopedics. He was advised to wear a compression sleeve over the knee while at work. This has resolved the cyst and he no longer is having pain.  Past Medical History: Patient Active Problem List    Diagnosis Date Noted  . Borderline hyperlipidemia 09/06/2020  . Baker's cyst of knee, left 10/24/2019  . GERD without esophagitis 07/25/2019  . Anxiety 12/04/2018   Past Surgical History:  Procedure Laterality Date  . Torn eye muscle Left 2008   Family History  Family history unknown: Yes   Outpatient Medications Prior to Visit  Medication Sig Dispense Refill  . omeprazole (PRILOSEC) 20 MG capsule Take 20 mg by mouth daily.    Marland Kitchen escitalopram (LEXAPRO) 10 MG tablet TAKE 1 TABLET BY MOUTH EVERY DAY (Patient taking differently: Take 10 mg by mouth 2 (two) times daily.) 90 tablet 1  . hydrOXYzine (ATARAX/VISTARIL) 10 MG tablet Take 1 tablet (10 mg total) by mouth 3 (three) times daily as needed (anxiety). 30 tablet 0  . meloxicam (MOBIC) 15 MG tablet Take 15 mg by mouth daily.     No facility-administered medications prior to visit.   No Known Allergies    Objective:   Today's Vitals   09/06/20 1000  BP: 118/76  Pulse: 65  Temp: 97.8 F (36.6 C)  TempSrc: Temporal  SpO2: 99%  Weight: 186 lb 3.2 oz (84.5 kg)  Height: 6\' 2"  (1.88 m)   Body mass index is 23.91 kg/m.   General: Well developed, well nourished. No acute distress. Psych: Alert and oriented. Normal mood and affect.  Health Maintenance Due  Topic Date Due  . COLONOSCOPY (Pts 45-29yrs Insurance coverage will need to be confirmed)  Never done  . COVID-19 Vaccine (  3 - Booster for Moderna series) 02/27/2020     Assessment & Plan:   1. Anxiety Mr. Blankenbaker is noting his anxiety is not as well controlled any longer on Lexapro 20mg  daily (10 mg bid). We discussed options and will try a switch to Paxil, starting at 10 mg daily, and titrating up to 20 mg daily after 7 days. I will plan to see him back in 6 weeks to monitor effectiveness.  - hydrOXYzine (ATARAX/VISTARIL) 10 MG tablet; Take 1 tablet (10 mg total) by mouth 3 (three) times daily as needed (anxiety).  Dispense: 30 tablet; Refill: 0 - PARoxetine (PAXIL)  20 MG tablet; Take 0.5 tablets (10 mg total) by mouth daily for 7 days, THEN 1 tablet (20 mg total) daily.  Dispense: 38.5 tablet; Refill: 0  2. Borderline hyperlipidemia ACC/AHA 10-year risk of major CV event is 2%. We will continue to periodically monitor this.  3. GERD without esophagitis Stable on Prilosec.  , MD

## 2020-10-22 ENCOUNTER — Other Ambulatory Visit: Payer: Self-pay

## 2020-10-22 ENCOUNTER — Encounter: Payer: Self-pay | Admitting: Family Medicine

## 2020-10-22 ENCOUNTER — Ambulatory Visit: Payer: 59 | Admitting: Family Medicine

## 2020-10-22 VITALS — BP 118/76 | HR 71 | Temp 97.6°F | Ht 74.0 in | Wt 183.8 lb

## 2020-10-22 DIAGNOSIS — E785 Hyperlipidemia, unspecified: Secondary | ICD-10-CM

## 2020-10-22 DIAGNOSIS — F419 Anxiety disorder, unspecified: Secondary | ICD-10-CM | POA: Diagnosis not present

## 2020-10-22 MED ORDER — VENLAFAXINE HCL ER 37.5 MG PO CP24: ORAL_CAPSULE | ORAL | 0 refills | Status: DC

## 2020-10-22 NOTE — Progress Notes (Signed)
[facility] [address] [place] [address] Dept: [phone] Dept Fax: [phone]  Office Visit  Subjective:    Patient ID: Ronald [name], male    DOB: 1970-08-01, 50 y.o..   MRN: [id number]  Chief Complaint  Patient presents with   Follow-up    6 week f/u anxiety/meds.  C/o having  muscle cramping in abdominal/chest area, HA's, dizziness x 1 week.       History of Present Illness:  Patient is in today for reassessment of his anxiety. Mr. Gnau was seen by me in late April. He had been on Lexapro for about 10 years for anxiety, but was noting this was not as effective for him. We switched him to Paxil, with a starting dose of 10 mg and titrating up to 20 mg a day. He tolerated this well initially, but now complains of pains in his right chest, muscle spasm in his forearms with general muscle achiness, some headache and dizziness. He believes this is related to his medication change. He also had a concern could this be related to a blood sugar issue, as he had some mild improvement of symptoms when he eats.  Past Medical History: Patient Active Problem List   Diagnosis Date Noted   Borderline hyperlipidemia 09/06/2020   Baker's cyst of knee, left 10/24/2019   GERD without esophagitis 07/25/2019   Anxiety 12/04/2018   Past Surgical History:  Procedure Laterality Date   Torn eye muscle Left 2008   Family History  Family history unknown: Yes   Outpatient Medications Prior to Visit  Medication Sig Dispense Refill   hydrOXYzine (ATARAX/VISTARIL) 10 MG tablet Take 1 tablet (10 mg total) by mouth 3 (three) times daily as needed (anxiety). 30 tablet 0   omeprazole (PRILOSEC) 20 MG capsule Take 20 mg by mouth daily.     PARoxetine (PAXIL) 20 MG tablet Take 0.5 tablets (10 mg total) by mouth daily for 7 days, THEN 1 tablet (20 mg total) daily. 38.5 tablet 0   No facility-administered medications prior to visit.    Allergies  Allergen Reactions   Paroxetine Hcl     Cramps, muscles spasm, dizziness   Objective:   Today's Vitals   10/22/20 1509  BP: 118/76  Pulse: 71  Temp: 97.6 F (36.4 C)  TempSrc: Temporal  SpO2: 97%  Weight: 183 lb 12.8 oz (83.4 kg)  Height: 6\' 2"  (1.88 m)   Body mass index is 23.6 kg/m.   General: Well developed, well nourished. No acute distress. Psych: Alert and oriented x3. Normal mood and affect.  Health Maintenance Due  Topic Date Due   COLONOSCOPY (Pts 45-28yrs Insurance coverage will need to be confirmed)  Never done   COVID-19 Vaccine (3 - Booster for Moderna series) 01/28/2020     Assessment & Plan:   1. Anxiety The symptoms Mr. Dewolfe has developed could be related to his medication. I will have him titrate down on his Paxil and then stop for a day prior to starting Effexor. We will titrate up on this after 2 weeks. I would like to see him back in 6 weeks to reassess efficacy. I will also check some labs to screen for other possible causes of his anxiety and the current symptoms.  - venlafaxine XR (EFFEXOR XR) 37.5 MG 24 hr capsule; Take 1 capsule (37.5 mg total) by mouth daily with breakfast for 14 days, THEN 2 capsules (75 mg total) daily with breakfast for 28 days.  Dispense:  70 capsule; Refill: 0 - Hemoglobin A1c; Future - Glucose, random; Future - TSH; Future  2. Borderline hyperlipidemia Mr. Coffelt is due for follow-up lipids.  - Lipid panel; Future  Loyola Mast, MD

## 2020-10-27 ENCOUNTER — Encounter: Payer: Self-pay | Admitting: Family Medicine

## 2020-10-27 ENCOUNTER — Other Ambulatory Visit: Payer: Self-pay

## 2020-10-27 ENCOUNTER — Other Ambulatory Visit (INDEPENDENT_AMBULATORY_CARE_PROVIDER_SITE_OTHER): Payer: 59

## 2020-10-27 DIAGNOSIS — R7989 Other specified abnormal findings of blood chemistry: Secondary | ICD-10-CM | POA: Insufficient documentation

## 2020-10-27 DIAGNOSIS — F419 Anxiety disorder, unspecified: Secondary | ICD-10-CM | POA: Diagnosis not present

## 2020-10-27 DIAGNOSIS — E785 Hyperlipidemia, unspecified: Secondary | ICD-10-CM | POA: Diagnosis not present

## 2020-10-27 LAB — LIPID PANEL
Cholesterol: 240 mg/dL — ABNORMAL HIGH (ref 0–200)
HDL: 63.5 mg/dL (ref >39.00)
LDL Cholesterol: 159 mg/dL — ABNORMAL HIGH (ref 0–99)
NonHDL: 176.02
Total CHOL/HDL Ratio: 4
Triglycerides: 87 mg/dL (ref 0.0–149.0)
VLDL: 17.4 mg/dL (ref 0.0–40.0)

## 2020-10-27 LAB — GLUCOSE, RANDOM: Glucose, Bld: 90 mg/dL (ref 70–99)

## 2020-10-27 LAB — HEMOGLOBIN A1C: Hgb A1c MFr Bld: 5.5 % (ref 4.6–6.5)

## 2020-10-27 LAB — TSH: TSH: 4.65 u[IU]/mL — ABNORMAL HIGH (ref 0.35–4.50)

## 2020-10-27 NOTE — Progress Notes (Signed)
Per orders of Dr. Rudd pt is here for labs, pt tolerated draw well.  

## 2020-12-01 ENCOUNTER — Other Ambulatory Visit: Payer: Self-pay | Admitting: Family Medicine

## 2020-12-01 DIAGNOSIS — F419 Anxiety disorder, unspecified: Secondary | ICD-10-CM

## 2020-12-01 NOTE — Telephone Encounter (Signed)
LR 10/22/20 LOV 10/22/20 FOV 12/03/20  Will fill at upcoming appt. Dm/cma

## 2020-12-02 ENCOUNTER — Other Ambulatory Visit: Payer: Self-pay

## 2020-12-03 ENCOUNTER — Encounter: Payer: Self-pay | Admitting: Family Medicine

## 2020-12-03 ENCOUNTER — Ambulatory Visit: Payer: 59 | Admitting: Family Medicine

## 2020-12-03 VITALS — BP 120/82 | HR 83 | Temp 98.2°F | Ht 74.0 in | Wt 182.2 lb

## 2020-12-03 DIAGNOSIS — F419 Anxiety disorder, unspecified: Secondary | ICD-10-CM

## 2020-12-03 DIAGNOSIS — R6882 Decreased libido: Secondary | ICD-10-CM

## 2020-12-03 DIAGNOSIS — R7989 Other specified abnormal findings of blood chemistry: Secondary | ICD-10-CM | POA: Diagnosis not present

## 2020-12-03 MED ORDER — VENLAFAXINE HCL ER 37.5 MG PO CP24: 37.5000 mg | ORAL_CAPSULE | Freq: Every day | ORAL | 3 refills | Status: DC

## 2020-12-03 NOTE — Progress Notes (Signed)
[facility] [address] [place] [address] Dept: [phone] Dept Fax: [phone]  Office Visit  Subjective:    Patient ID: Evelena Asa, male    DOB: January 27, 1971, 50 y.o..   MRN: [id number]  Chief Complaint  Patient presents with   Follow-up    F/u Anxiety/meds.      History of Present Illness:  Patient is in today for reassessment of his anxiety. At his last visit, we changed his medication from Paxil to Effexor, as he was having muscle spasms, headache, and dizziness that could have been a medication side effect. The plan had been to start at 37.5 mg daily of Effexor and increase after 1 month to 75 mg daily. However, he feels he is doing quite well on the 37.5 mg dose. Mr. Ragle does note some decreased libido, which he states predates starting on the Effexor or the Paxil. He notes that when he engages in intercourse, he is able to achieve an erection without difficulty.  Past Medical History: Patient Active Problem List   Diagnosis Date Noted   Low libido 12/03/2020   Elevated TSH 10/27/2020   Borderline hyperlipidemia 09/06/2020   Baker's cyst of knee, left 10/24/2019   GERD without esophagitis 07/25/2019   Anxiety 12/04/2018   Past Surgical History:  Procedure Laterality Date   Torn eye muscle Left 2008   Family History  Family history unknown: Yes   Outpatient Medications Prior to Visit  Medication Sig Dispense Refill   hydrOXYzine (ATARAX/VISTARIL) 10 MG tablet Take 1 tablet (10 mg total) by mouth 3 (three) times daily as needed (anxiety). 30 tablet 0   omeprazole (PRILOSEC) 20 MG capsule Take 20 mg by mouth daily.     venlafaxine XR (EFFEXOR-XR) 75 MG 24 hr capsule Take 1 capsule (75 mg total) by mouth daily with breakfast. (Patient taking differently: Take 37.5 mg by mouth daily with breakfast.) 90 capsule 1   No facility-administered medications prior to visit.   Allergies  Allergen  Reactions   Paroxetine Hcl     Cramps, muscles spasm, dizziness   Objective:   Today's Vitals   12/03/20 1517  BP: 120/82  Pulse: 83  Temp: 98.2 F (36.8 C)  TempSrc: Temporal  SpO2: 98%  Weight: 182 lb 3.2 oz (82.6 kg)  Height: 6\' 2"  (1.88 m)   Body mass index is 23.39 kg/m.   General: Well developed, well nourished. No acute distress. Psych: Alert and oriented x3. Normal mood and affect.  Health Maintenance Due  Topic Date Due   COLONOSCOPY (Pts 45-62yrs Insurance coverage will need to be confirmed)  Never done   COVID-19 Vaccine (3 - Booster for Moderna series) 01/28/2020    Lab Results Lab Results  Component Value Date   HGBA1C 5.5 10/27/2020   Lab Results  Component Value Date   TSH 4.65 (H) 10/27/2020     Assessment & Plan:   1. Anxiety Improved on Effexor. We will plan to keep his dose at 37.5 mg.  - venlafaxine XR (EFFEXOR-XR) 37.5 MG 24 hr capsule; Take 1 capsule (37.5 mg total) by mouth daily with breakfast.  Dispense: 90 capsule; Refill: 3  2. Elevated TSH Discussed the unexpected increase in his TSH. I reviewed normal regulation of thyroid function. I will see him back in Sept. and will plan to check a TSH and T4 at that point.  3. Low libido We discussed that this could be a medication side effect with SSRIs and SNRIs.  However, this could also represent a change in testosterone levels or be a natural part of aging. This is not currently interfering with his relationship, so we will monitor this for now.  Loyola Mast, MD

## 2021-02-18 IMAGING — CR CHEST - 2 VIEW
2 series · 2 of 2 positions shown · non-contrast
Comparison: None.

CLINICAL DATA: Fatigue and lightheaded for 2 days.

EXAM:
CHEST - 2 VIEW

[chest pa]
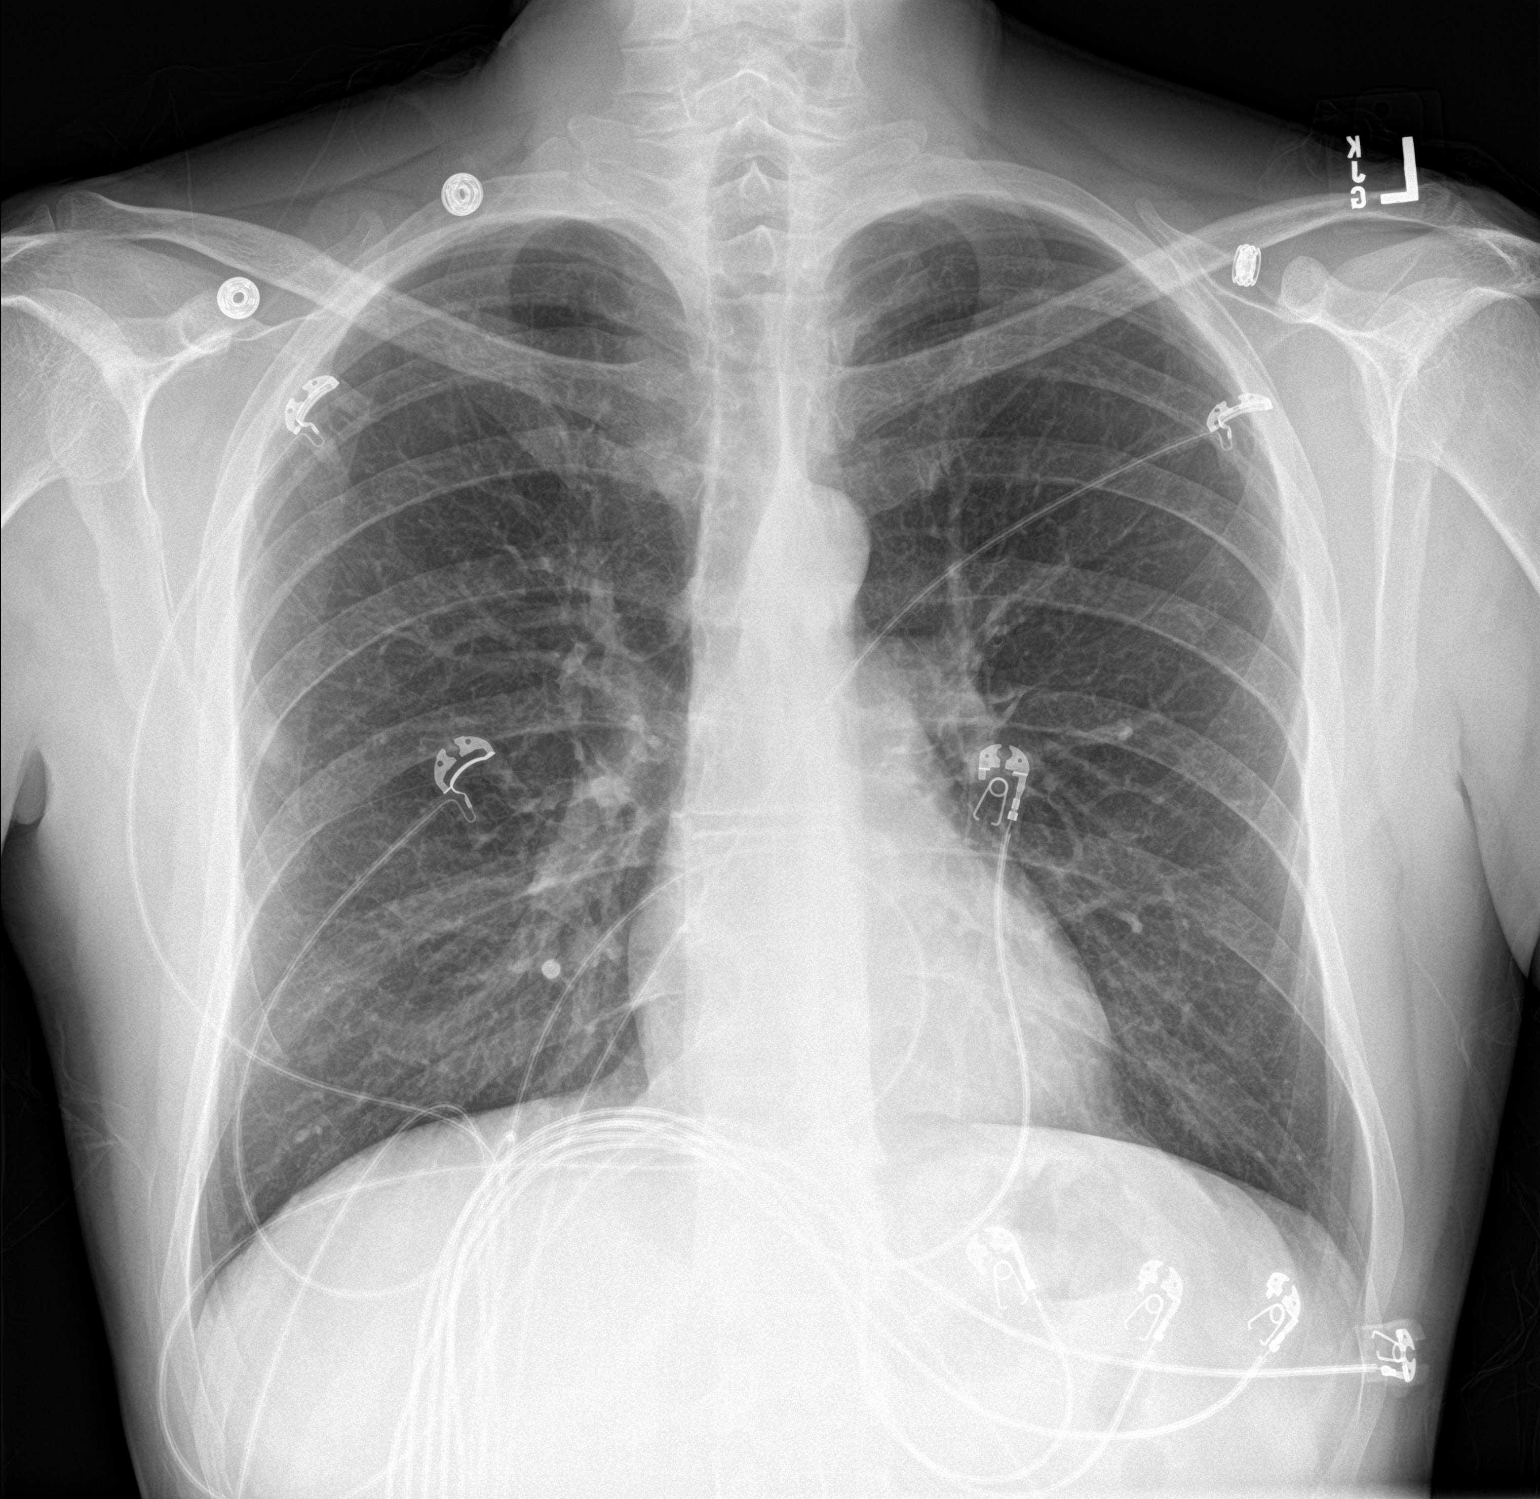

[chest lat]
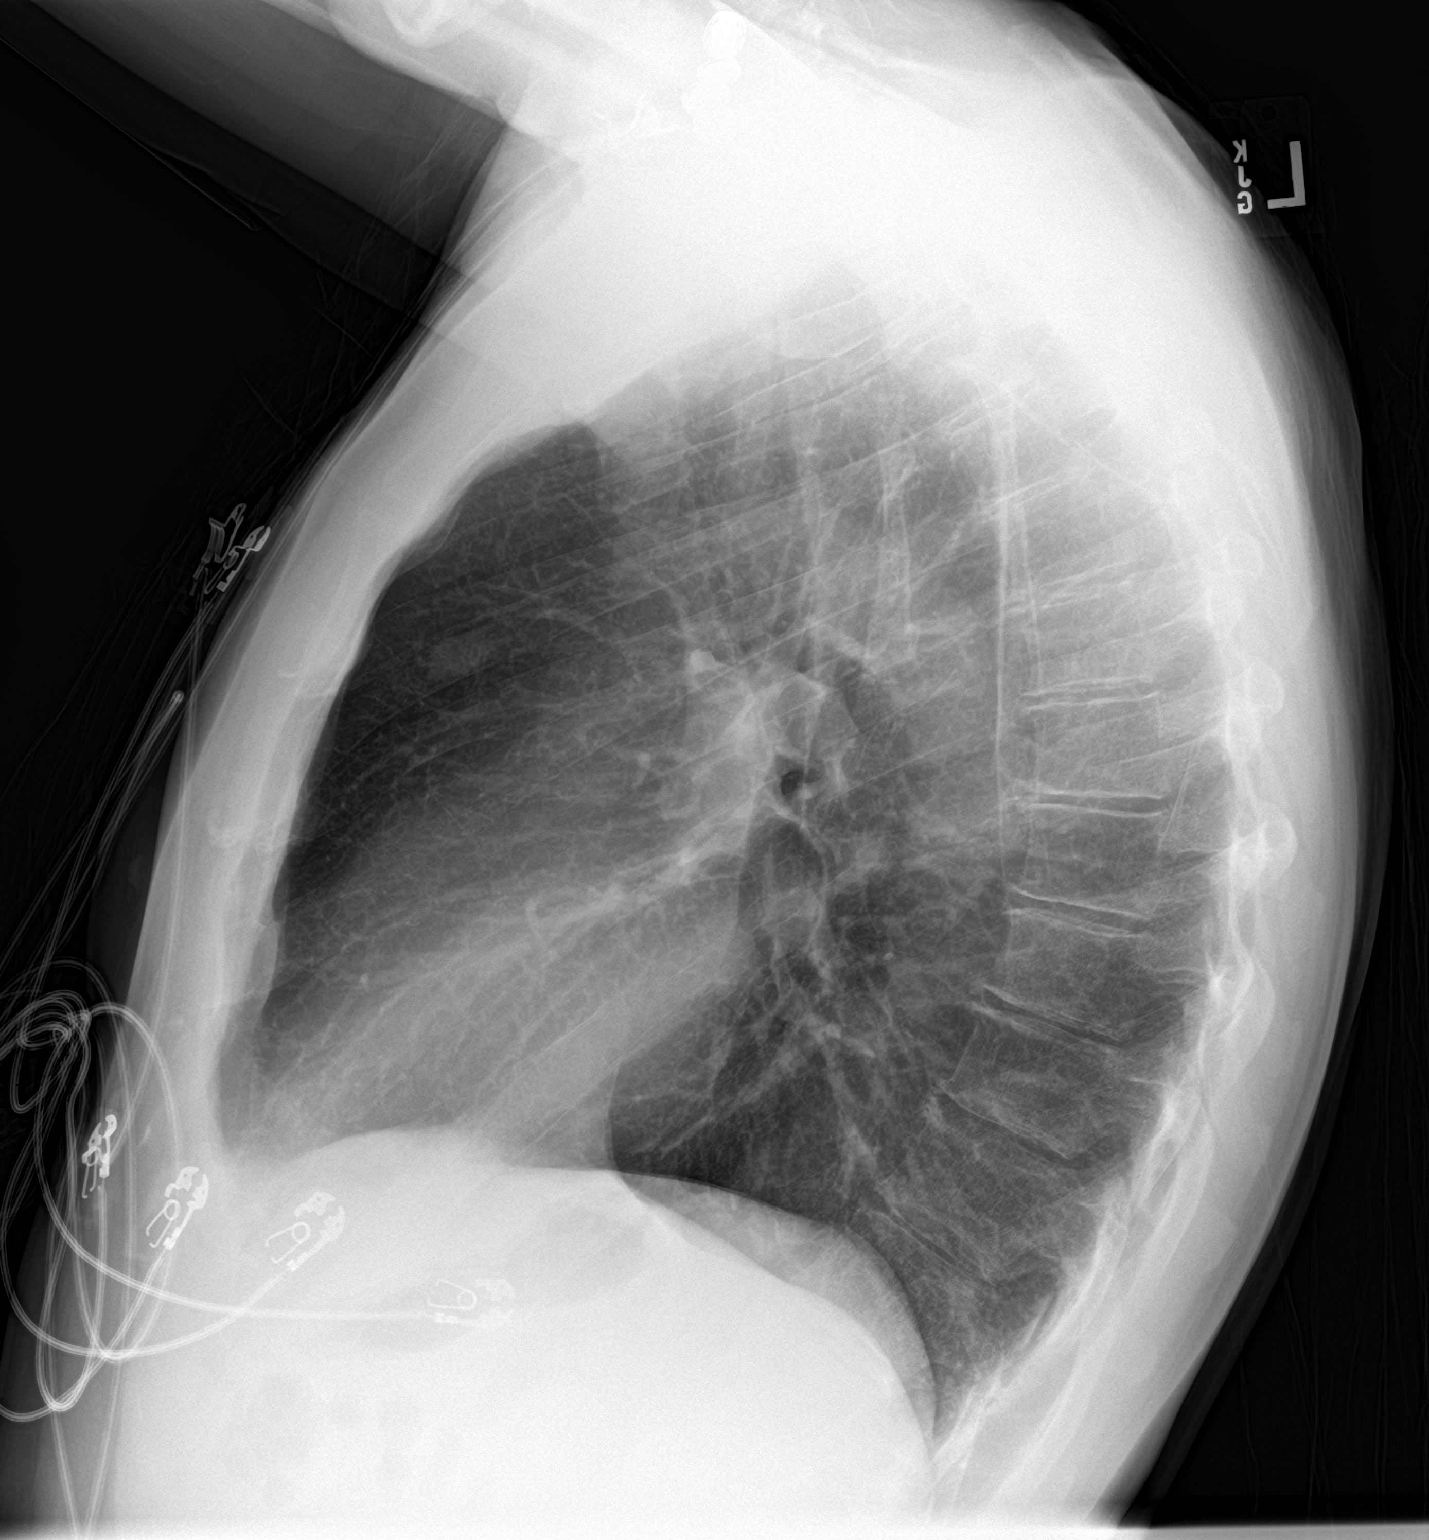

[2 of 2 positions shown; findings below may reference images not displayed]

FINDINGS: The cardiac silhouette, mediastinal and hilar contours are within
normal limits. No infiltrates, edema or effusions. There is a
rounded nodular density in the right upper lobe which is an
indeterminate finding without prior chest films. Chest CT
recommended to exclude a pulmonary nodule given smoking history.

The bony thorax is intact.
IMPRESSION: 1. No acute cardiopulmonary findings.
2. Suspect right upper lobe pulmonary nodule. Chest CT suggested for
further evaluation.

## 2021-02-28 IMAGING — US ULTRASOUND ABDOMEN COMPLETE
1 series · 13 of 25 positions shown · non-contrast
Comparison: None.

CLINICAL DATA: Initial evaluation for epigastric pain.

EXAM:
ABDOMEN ULTRASOUND COMPLETE

[Series 1: ultrasound abdomen complete · 13 of 91 slices shown]
[im 1/91]
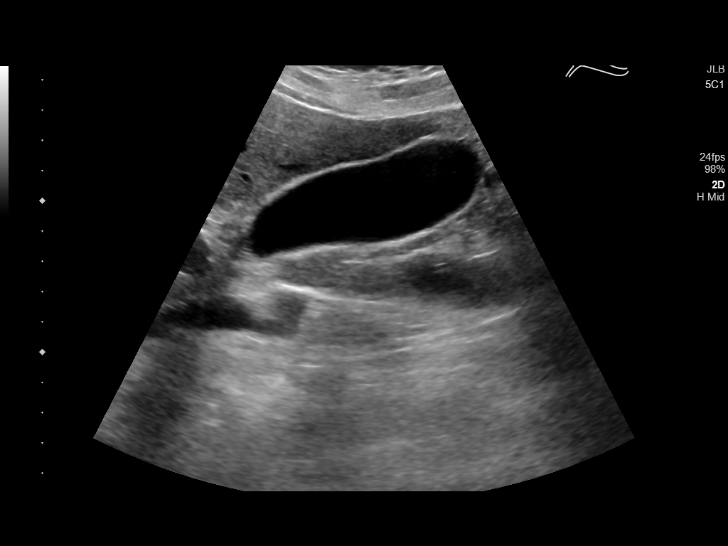
[im 8/91]
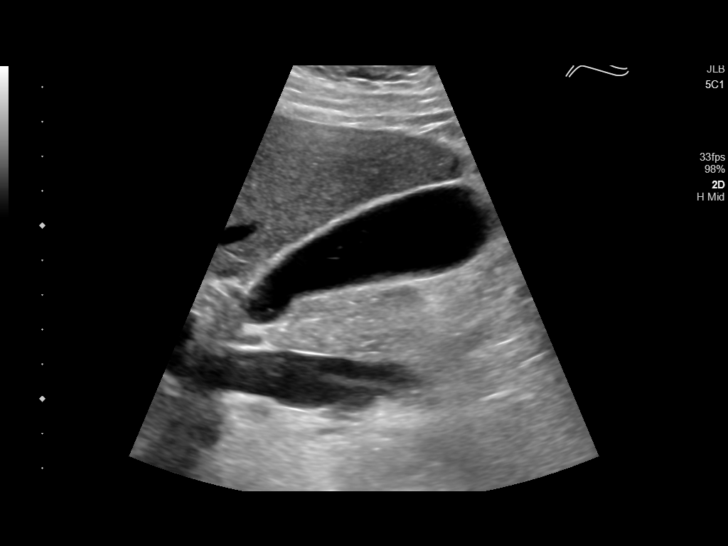
[im 16/91]
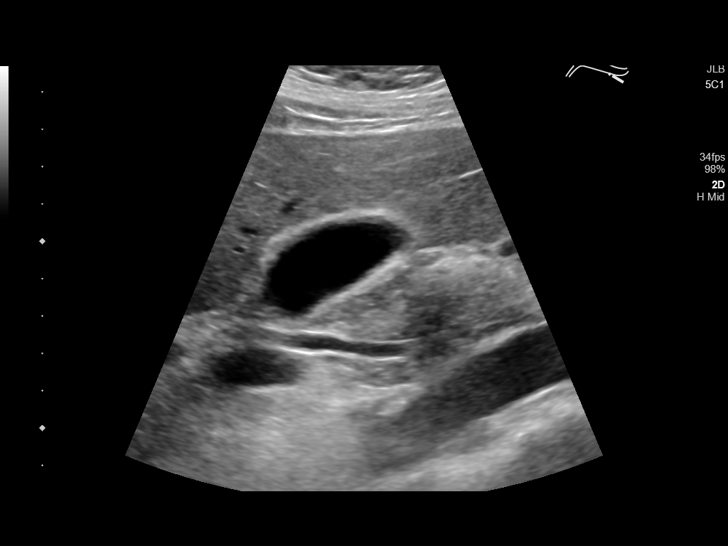
[im 23/91]
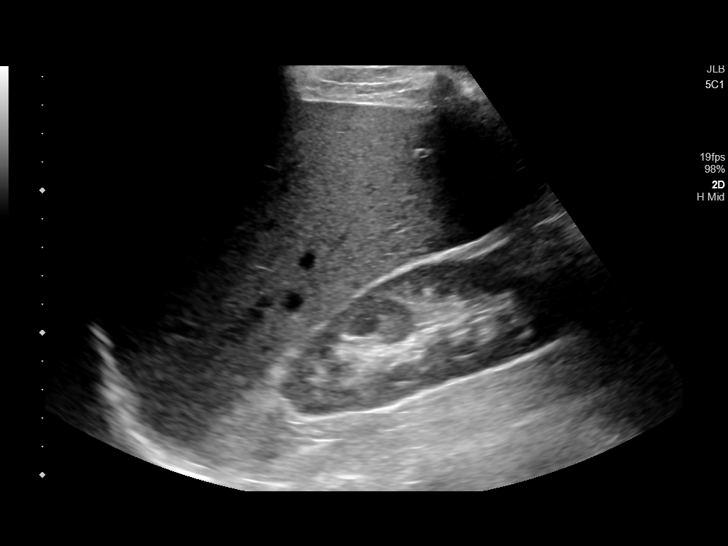
[im 31/91]
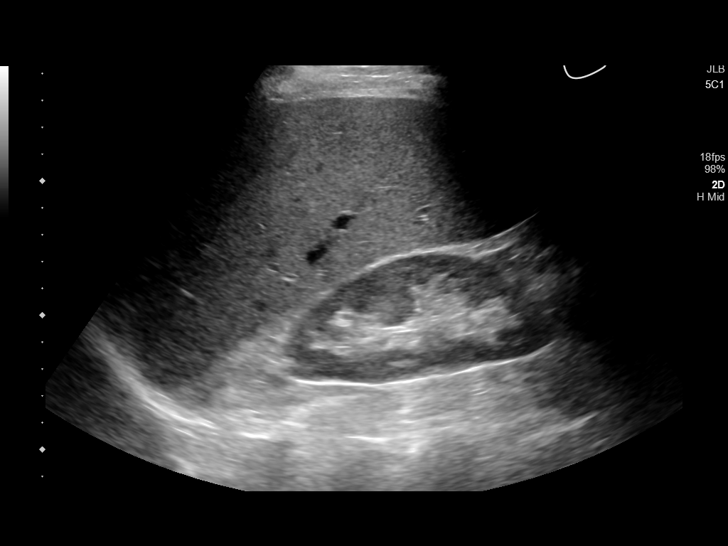
[im 38/91]
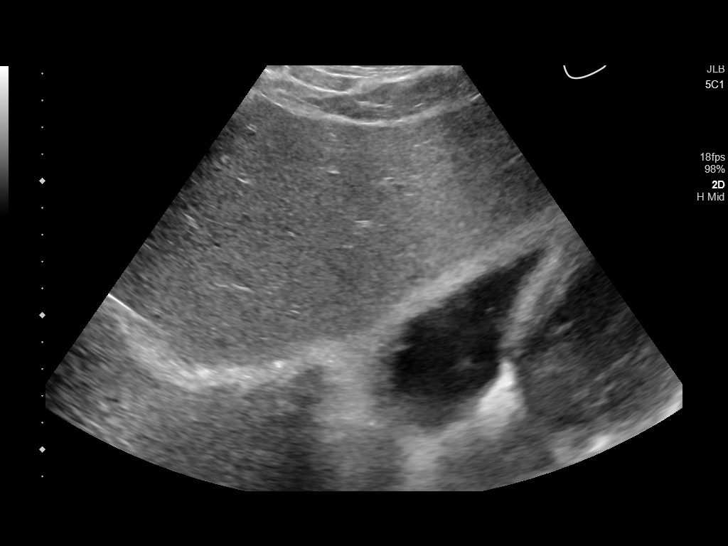
[im 46/91]
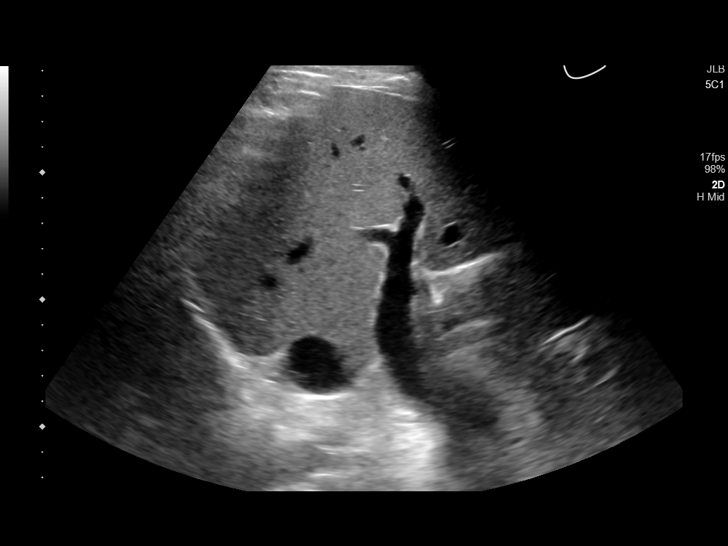
[im 53/91]
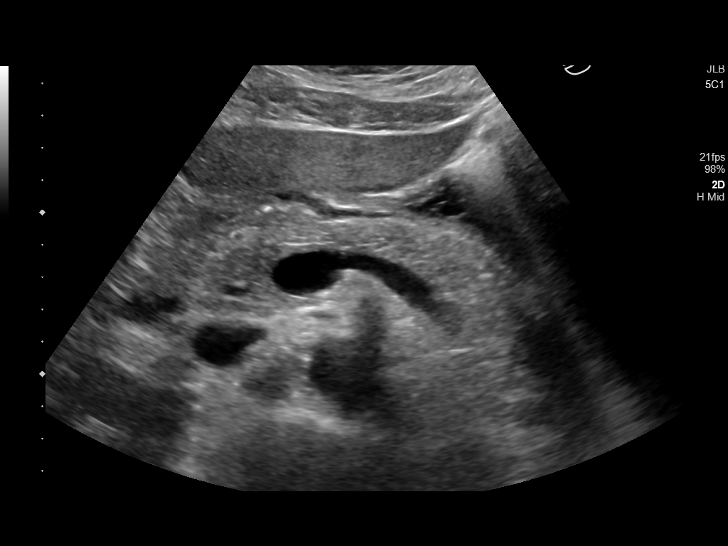
[im 61/91]
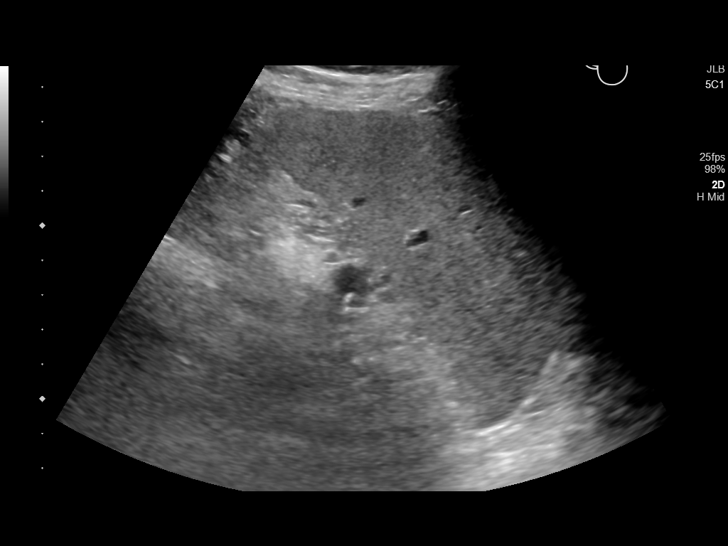
[im 68/91]
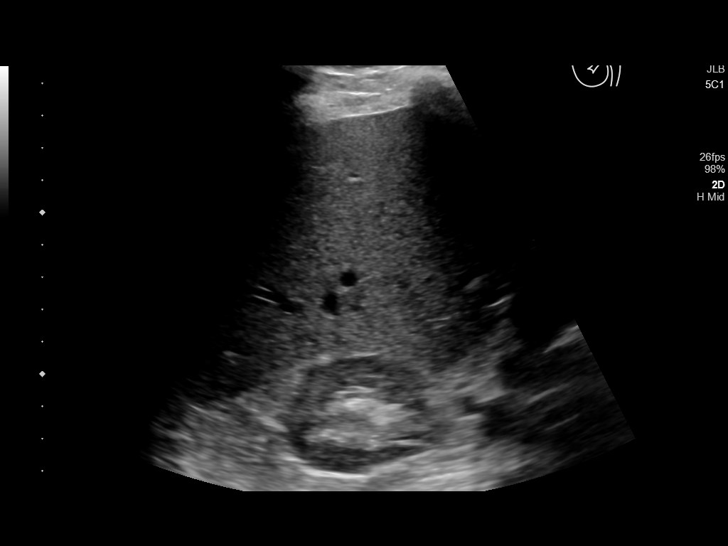
[im 76/91]
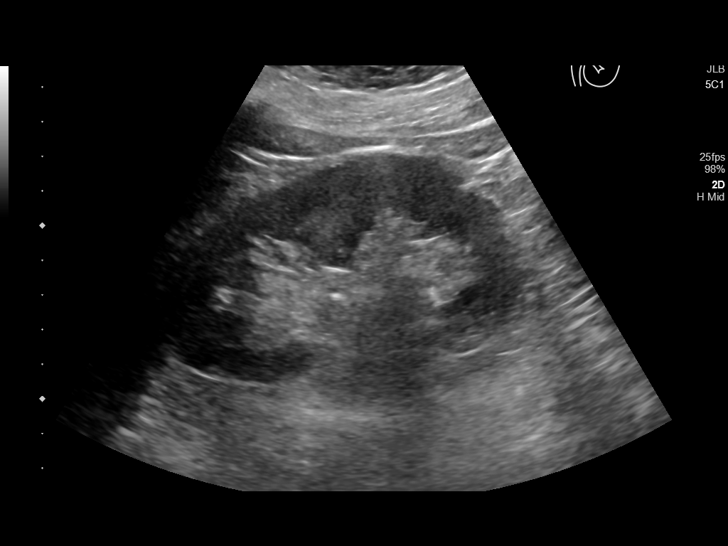
[im 83/91]
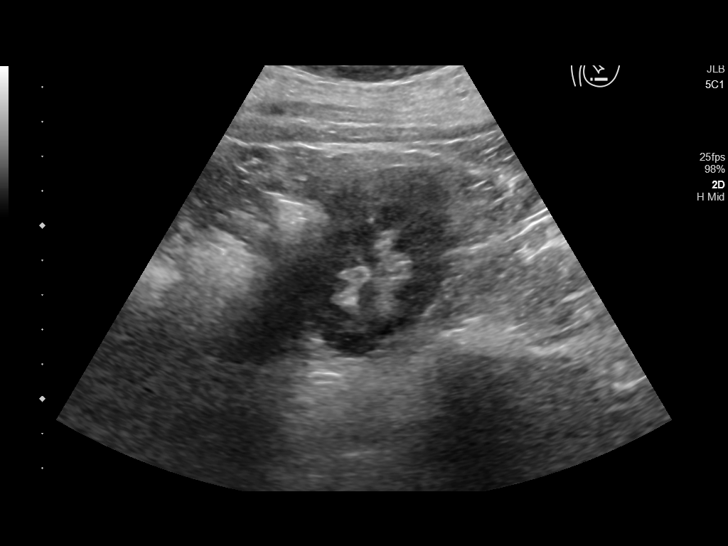
[im 91/91]
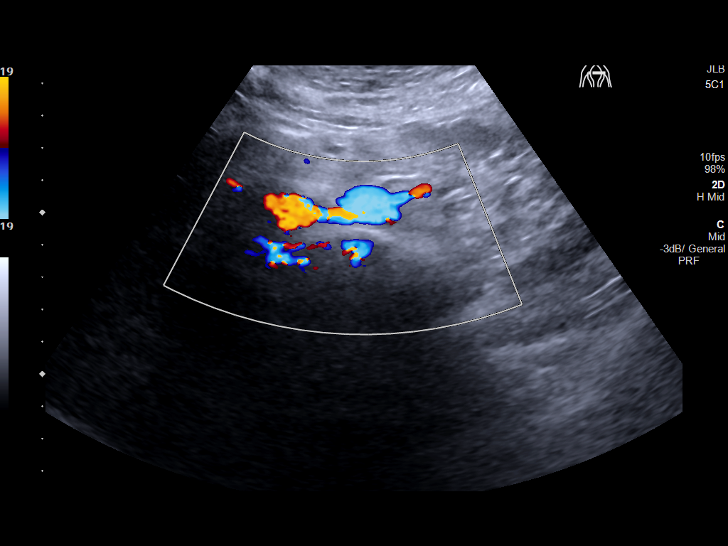

[13 of 25 positions shown; findings below may reference images not displayed]

FINDINGS: Gallbladder: Subtle echogenic material within the gallbladder lumen
likely reflects a small amount of sludge. No frank shadowing
echogenic calculi. Gallbladder wall measure within normal limits at
2.4 mm. No free pericholecystic fluid. No sonographic Murphy sign
elicited on exam.

Common bile duct: Diameter: 3.9 mm

Liver: No focal lesion identified. Within normal limits in
parenchymal echogenicity. Portal vein is patent on color Doppler
imaging with normal direction of blood flow towards the liver.

IVC: No abnormality visualized.

Pancreas: Visualized portion unremarkable.

Spleen: Size and appearance within normal limits.

Right Kidney: Length: 11.0 cm. Echogenicity within normal limits. No
mass or hydronephrosis visualized.

Left Kidney: Length: 11.4 cm. Echogenicity within normal limits. No
mass or hydronephrosis visualized.

Abdominal aorta: No aneurysm visualized.

Other findings: None.
IMPRESSION: 1. Mild gallbladder sludge. No frank cholelithiasis or evidence for
acute cholecystitis. No biliary dilatation.
2. Otherwise unremarkable and normal abdominal ultrasound.

## 2021-04-03 IMAGING — NM NUCLEAR MEDICINE HEPATOBILIARY IMAGING WITH GALLBLADDER EF
2 series · 12 of 12 positions shown · non-contrast
Comparison: None

CLINICAL DATA: Epigastric abdominal pain

EXAM:
NUCLEAR MEDICINE HEPATOBILIARY IMAGING WITH GALLBLADDER EF
TECHNIQUE: Sequential images of the abdomen were obtained [DATE] minutes
following intravenous administration of radiopharmaceutical. After
oral ingestion of Ensure, gallbladder ejection fraction was
determined. At 60 min, normal ejection fraction is greater than 33%.
RADIOPHARMACEUTICALS:  5.4 mCi Rc-DDm  Choletec IV

[Series 1: raw data · 4.46mm/px · 6 of 60 frames shown (1 of 2)]
[frame 6/60]
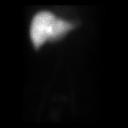
[frame 16/60]
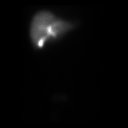
[frame 26/60]
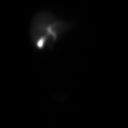
[frame 36/60]
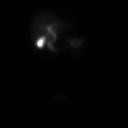
[frame 46/60]
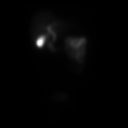
[frame 56/60]
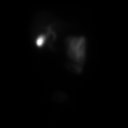

[Series 1: raw data · 4.46mm/px · 6 of 60 frames shown (2 of 2)]
[frame 6/60]
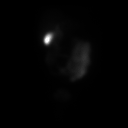
[frame 16/60]
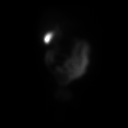
[frame 26/60]
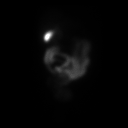
[frame 36/60]
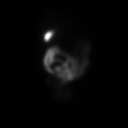
[frame 46/60]
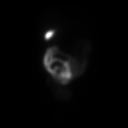
[frame 56/60]
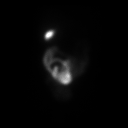

[12 of 12 positions shown; findings below may reference images not displayed]

FINDINGS: Normal tracer extraction from bloodstream indicating normal
hepatocellular function.

Normal excretion of tracer into biliary tree.

Gallbladder visualized at 10 min.

Small bowel visualized at 29 min.

No hepatic retention of tracer.

Subjectively normal emptying of tracer from gallbladder following
fatty meal stimulation.

Calculated gallbladder ejection fraction is 55%, normal.

Patient reported no symptoms following Ensure ingestion.

Normal gallbladder ejection fraction following Ensure ingestion is
greater than 33% at 1 hour.
IMPRESSION: Patent biliary tree with normal gallbladder ejection fraction of 55%
following fatty meal stimulation.

## 2021-06-13 ENCOUNTER — Other Ambulatory Visit: Payer: Self-pay

## 2021-06-14 ENCOUNTER — Ambulatory Visit: Payer: 59 | Admitting: Family Medicine

## 2021-06-14 ENCOUNTER — Encounter: Payer: Self-pay | Admitting: Family Medicine

## 2021-06-14 VITALS — BP 134/76 | HR 90 | Temp 98.3°F | Ht 74.0 in | Wt 183.8 lb

## 2021-06-14 DIAGNOSIS — R7989 Other specified abnormal findings of blood chemistry: Secondary | ICD-10-CM

## 2021-06-14 DIAGNOSIS — F411 Generalized anxiety disorder: Secondary | ICD-10-CM

## 2021-06-14 LAB — TSH: TSH: 3.31 u[IU]/mL (ref 0.35–5.50)

## 2021-06-14 LAB — T4, FREE: Free T4: 0.72 ng/dL (ref 0.60–1.60)

## 2021-06-14 MED ORDER — HYDROXYZINE HCL 10 MG PO TABS: 10.0000 mg | ORAL_TABLET | Freq: Three times a day (TID) | ORAL | 2 refills | Status: DC | PRN

## 2021-06-14 MED ORDER — VENLAFAXINE HCL ER 75 MG PO CP24: 75.0000 mg | ORAL_CAPSULE | Freq: Every day | ORAL | 11 refills | Status: DC

## 2021-06-14 NOTE — Progress Notes (Signed)
Ronald [name] Ronald [name] [address] [place] [address] Dept: [phone] Dept Fax: [phone]  Chronic Care Office Visit  Subjective:    Patient ID: Ronald [name], male    DOB: February 01, 1971, 51 y.o..   MRN: [id number]  Chief Complaint  Patient presents with   Follow-up    F/u meds.  Medication not working for him.  Declines flu shot.     History of Present Illness:  Patient is in today for reassessment of chronic medical issues.  Ronald [name] has a history of generalized anxiety. Last Spring, we changed his medication from Paxil to Effexor, as he was having muscle spasms, headache, and dizziness that could have been a medication side effect. The plan had been to start at 37.5 mg daily of Effexor and increase after 1 month to 75 mg daily. However, he felt he was doing quite well on the 37.5 mg dose, so maintained this. However, starting 2 weeks ago, he noted that it felt like his medication was no longer working for him. He did try and go up on his dose for 3 days to 75 mg, but was not seeing a benefit, so dropped back to his previous dose. He has not taken any of the hydroxyzine that was prescribed for him, as he didn't recall what this was for.  Ronald [name] was found to have an elevated TSH last July. He was to have follow-up testing in Sept., but had not returned until today.  Past Medical History: Patient Active Problem List   Diagnosis Date Noted   Low libido 12/03/2020   Elevated TSH 10/27/2020   Borderline hyperlipidemia 09/06/2020   Baker's cyst of knee, left 10/24/2019   GERD without esophagitis 07/25/2019   Generalized anxiety disorder 12/04/2018   Past Surgical History:  Procedure Laterality Date   Torn eye muscle Left 2008   Family History  Family history unknown: Yes   Outpatient Medications Prior to Visit  Medication Sig Dispense Refill   omeprazole (PRILOSEC) 20 MG capsule Take 20 mg by mouth  daily.     venlafaxine XR (EFFEXOR-XR) 37.5 MG 24 hr capsule Take 1 capsule (37.5 mg total) by mouth daily with breakfast. 90 capsule 3   hydrOXYzine (ATARAX/VISTARIL) 10 MG tablet Take 1 tablet (10 mg total) by mouth 3 (three) times daily as needed (anxiety). (Patient not taking: Reported on 06/14/2021) 30 tablet 0   No facility-administered medications prior to visit.   Allergies  Allergen Reactions   Paroxetine Hcl     Cramps, muscles spasm, dizziness   Objective:   Today's Vitals   06/14/21 1126  BP: 134/76  Pulse: 90  Temp: 98.3 F (36.8 C)  TempSrc: Temporal  SpO2: 98%  Weight: 183 lb 12.8 oz (83.4 kg)  Height: 6\' 2"  (1.88 m)   Body mass index is 23.6 kg/m.   General: Well developed, well nourished. No acute distress. Psych: Alert and oriented. Normal mood and affect.  Health Maintenance Due  Topic Date Due   COLONOSCOPY (Pts 45-45yrs Insurance coverage will need to be confirmed)  Never done   COVID-19 Vaccine (3 - Booster for Moderna series) 10/23/2019   INFLUENZA VACCINE  Never done   Lab Results Last thyroid functions Lab Results  Component Value Date   TSH 4.65 (H) 10/27/2020     Assessment & Plan:   1. Generalized anxiety disorder Ronald [name] is having an increase in his generalized anxiety over the past 2 weeks. I explained to him that  with a medication change it can take up to 6 weeks to assess the effectiveness. I don't feel he gave an adequate trial on the high Effexor dose. I recommend we do step him up to 75 mg daily for 6 weeks. I will have him use the hydroxyzine for sleep and for panic attacks outside of work. I will reassess him in 6 weeks.  - venlafaxine XR (EFFEXOR-XR) 75 MG 24 hr capsule; Take 1 capsule (75 mg total) by mouth daily with breakfast.  Dispense: 30 capsule; Refill: 11 - hydrOXYzine (ATARAX) 10 MG tablet; Take 1 tablet (10 mg total) by mouth 3 (three) times daily as needed (anxiety).  Dispense: 30 tablet; Refill: 2  2. Elevated  TSH We will repeat thyroid testing today.  - TSH - T4, free  Loyola Mast, MD

## 2021-07-26 ENCOUNTER — Other Ambulatory Visit: Payer: Self-pay

## 2021-07-27 ENCOUNTER — Ambulatory Visit: Payer: 59 | Admitting: Family Medicine

## 2021-07-27 ENCOUNTER — Encounter: Payer: Self-pay | Admitting: Family Medicine

## 2021-07-27 VITALS — BP 116/74 | HR 85 | Temp 98.3°F | Ht 74.0 in | Wt 178.0 lb

## 2021-07-27 DIAGNOSIS — F411 Generalized anxiety disorder: Secondary | ICD-10-CM

## 2021-07-27 MED ORDER — VENLAFAXINE HCL ER 75 MG PO CP24: 75.0000 mg | ORAL_CAPSULE | Freq: Every day | ORAL | 3 refills | Status: DC

## 2021-07-27 NOTE — Progress Notes (Signed)
?[facility] ?[address] ?[place] [address] ?Dept: [phone] ?Dept Fax: [phone] ? ?Office Visit ? ?Subjective:  ? ? Patient ID: Ronald [name], male    DOB: [birth year], 51 y.o..   MRN: [id number] ? ?Chief Complaint  ?Patient presents with  ? Follow-up  ?  6 week f/u anxiety.     ? ? ?History of Present Illness: ? ?Patient is in today for reassessment of his anxiety. Last Spring, we changed his medication from Paxil to Effexor, as he was having muscle spasms, headache, and dizziness that could have been a medication side effect. The plan had been to start at 37.5 mg daily of Effexor and increase after 1 month to 75 mg daily. However, he felt he was doing quite well on the 37.5 mg dose, so maintained this. However, starting 2 weeks prior to the last appointment, he noted that it felt like his medication was no longer working for him. He did try and go up on his dose for 3 days to 75 mg, but was not seeing a benefit, so dropped back to his previous dose. We discussed at the last visit about needing 6 weeks at a given dose to truly determine efficacy. He has now been at 75 mg and feels this is working quite well. 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 ? ?Past Medical History: ?Patient Active Problem List  ? Diagnosis Date Noted  ? Low libido 12/03/2020  ? Elevated TSH 10/27/2020  ? Borderline hyperlipidemia 09/06/2020  ? Baker's cyst of knee, left 10/24/2019  ? GERD without esophagitis 07/25/2019  ? Generalized anxiety disorder 12/04/2018  ? ?Past Surgical History:  ?Procedure Laterality Date  ? Torn eye muscle Left 2008  ? ?Family History  ?Family history unknown: Yes  ? ?Outpatient Medications Prior to Visit  ?Medication Sig Dispense Refill  ? hydrOXYzine (ATARAX) 10 MG tablet Take 1 tablet (10 mg total) by mouth 3 (three) times daily as needed (anxiety). 30  tablet 2  ? omeprazole (PRILOSEC) 20 MG capsule Take 20 mg by mouth daily.    ? venlafaxine XR (EFFEXOR-XR) 75 MG 24 hr capsule Take 1 capsule (75 mg total) by mouth daily with breakfast. 30 capsule 11  ? ?No facility-administered medications prior to visit.  ? ?Allergies  ?Allergen Reactions  ? Paroxetine Hcl   ?  Cramps, muscles spasm, dizziness  ?   ?Objective:  ? ?Today's Vitals  ? 07/27/21 1348  ?BP: 116/74  ?Pulse: 85  ?Temp: 98.3 ?F (36.8 ?C)  ?TempSrc: Temporal  ?SpO2: 98%  ?Weight: 178 lb (80.7 kg)  ?Height: 6\' 2"  (1.88 m)  ? ?Body mass index is 22.85 kg/m?.  ? ?General: Well developed, well nourished. No acute distress. ?Psych: Alert and oriented. Normal mood and affect. ? ?Health Maintenance Due  ?Topic Date Due  ? COLONOSCOPY (Pts 45-81yrs Insurance coverage will need to be confirmed)  Never done  ? ?GAD 7 : Generalized Anxiety Score 07/27/2021 01/29/2020 10/24/2019 07/24/2019  ?Nervous, Anxious, on Edge 0 0 1 0  ?Control/stop worrying 0 0 0 0  ?Worry too much - different things 0 0 0 0  ?Trouble relaxing 0 0 0 0  ?Restless 0 0 0 0  ?Easily annoyed or irritable 0 0 0 0  ?Afraid - awful might happen 0 0 0 0  ?Total GAD 7 Score 0 0 1 0  ?Anxiety  Difficulty Not difficult at all Not difficult at all Not difficult at all Not difficult at all  ? ?Assessment & Plan:  ? ?1. Generalized anxiety disorder ?Mr. Werk is improved on a therapeutic dose of Effexor. We will continue this indefinitely. ? ?- venlafaxine XR (EFFEXOR-XR) 75 MG 24 hr capsule; Take 1 capsule (75 mg total) by mouth daily with breakfast.  Dispense: 90 capsule; Refill: 3 ? ? ?Return in about 6 months (around 01/27/2022) for Reassessment.  ? ?Loyola Mast, MD ?

## 2022-03-08 ENCOUNTER — Ambulatory Visit: Payer: 59 | Admitting: Nurse Practitioner

## 2022-03-10 ENCOUNTER — Ambulatory Visit: Payer: 59 | Admitting: Family Medicine

## 2022-03-10 ENCOUNTER — Encounter: Payer: Self-pay | Admitting: Family Medicine

## 2022-03-10 VITALS — BP 140/92 | HR 88 | Temp 98.0°F | Ht 74.0 in | Wt 189.6 lb

## 2022-03-10 DIAGNOSIS — M7711 Lateral epicondylitis, right elbow: Secondary | ICD-10-CM | POA: Diagnosis not present

## 2022-03-10 DIAGNOSIS — R030 Elevated blood-pressure reading, without diagnosis of hypertension: Secondary | ICD-10-CM | POA: Insufficient documentation

## 2022-03-10 NOTE — Progress Notes (Signed)
Established Patient Office Visit  Subjective   Patient ID: Ronald [name], male    DOB: 1970-11-02  Age: 51 y.o. MRN: [id number]  Chief Complaint  Patient presents with   Pain    Right elbow pain tender to touch unable to hold things symptoms x 1 month becoming worse.     HPI presents with a 1 month history of right elbow pain.  There was no particular injury.  Seem to start after he picked up his bulldog.  He is right-hand dominant.  Works as an Personnel officer.  Has no history of hypertension.    Review of Systems  Constitutional: Negative.   HENT: Negative.    Eyes:  Negative for blurred vision, discharge and redness.  Respiratory: Negative.    Cardiovascular: Negative.   Gastrointestinal:  Negative for abdominal pain.  Genitourinary: Negative.   Musculoskeletal:  Positive for joint pain and myalgias.  Skin:  Negative for rash.  Neurological:  Negative for tingling, loss of consciousness, weakness and headaches.  Endo/Heme/Allergies:  Negative for polydipsia.      Objective:     BP (!) 140/92 (BP Location: Left Arm, Patient Position: Sitting, Cuff Size: Normal)   Pulse 88   Temp 98 F (36.7 C) (Temporal)   Ht 6\' 2"  (1.88 m)   Wt 189 lb 9.6 oz (86 kg)   SpO2 97%   BMI 24.34 kg/m  BP Readings from Last 3 Encounters:  03/10/22 (!) 140/92  07/27/21 116/74  06/14/21 134/76      Physical Exam Constitutional:      General: He is not in acute distress.    Appearance: Normal appearance. He is not ill-appearing, toxic-appearing or diaphoretic.  HENT:     Head: Normocephalic and atraumatic.     Right Ear: External ear normal.     Left Ear: External ear normal.  Eyes:     General: No scleral icterus.       Right eye: No discharge.        Left eye: No discharge.     Extraocular Movements: Extraocular movements intact.     Conjunctiva/sclera: Conjunctivae normal.  Pulmonary:     Effort: Pulmonary effort is normal. No respiratory distress.  Musculoskeletal:      Right elbow: No swelling, effusion or lacerations. Normal range of motion. Tenderness present.     Comments: There was tenderness to palpation of the lateral epicondyle.  There was pain in this area with resisted extension at the wrist.  Skin:    General: Skin is warm and dry.  Neurological:     Mental Status: He is alert and oriented to person, place, and time.  Psychiatric:        Mood and Affect: Mood normal.        Behavior: Behavior normal.      No results found for any visits on 03/10/22.    The 10-year ASCVD risk score (Arnett DK, et al., 2019) is: 3.7%    Assessment & Plan:   Problem List Items Addressed This Visit       Musculoskeletal and Integument   Lateral epicondylitis of right elbow - Primary     Other   Elevated BP without diagnosis of hypertension    Return We will follow-up with Dr. 2020 in a month..  Information was given on tennis elbow as well as exercises to do.  Explained proper placement of an elbow strap.  He will use Voltaren gel 3-4 times daily.  Information was given on preventing  hypertension.  He has follow-up scheduled with Dr. Gena Fray in a month for recheck of tendinitis and his blood pressure.  Libby Maw, MD

## 2022-03-15 ENCOUNTER — Other Ambulatory Visit: Payer: Self-pay | Admitting: *Deleted

## 2022-03-21 ENCOUNTER — Ambulatory Visit: Payer: 59 | Admitting: Family Medicine

## 2022-07-05 ENCOUNTER — Ambulatory Visit: Payer: 59 | Admitting: Family Medicine

## 2022-07-05 ENCOUNTER — Encounter: Payer: Self-pay | Admitting: Family Medicine

## 2022-07-05 VITALS — BP 118/66 | HR 73 | Temp 98.7°F | Ht 74.0 in | Wt 187.6 lb

## 2022-07-05 DIAGNOSIS — F411 Generalized anxiety disorder: Secondary | ICD-10-CM | POA: Diagnosis not present

## 2022-07-05 MED ORDER — ALPRAZOLAM 0.25 MG PO TABS: 0.2500 mg | ORAL_TABLET | Freq: Two times a day (BID) | ORAL | 0 refills | Status: DC | PRN

## 2022-07-05 NOTE — Progress Notes (Signed)
[facility] [address] [place] [address] Dept: [phone] Dept Fax: [phone]  Office Visit  Subjective:    Patient ID: Ronald Ronald [name], male    DOB: 07/13/1970, 51 y.o..   MRN: [id number]  Chief Complaint  Patient presents with   Medical Management of Chronic Issues    F/u anxiety and meds.  C/o having elevated BP  130/98   History of Present Illness:  Patient is in today noting a recent flare of his anxiety. Ronald Ronald [name] has a history of generalized anxiety. This has been well controlled on venlafaxine 75 mg daily. He notes that over the past 2 weeks, he has been having acute episodes of anxiety associated with shifting pains in his trunk. He has tried to take his hydroxyzine, but finds this is ineffective. He is not aware of anything having changed for him that would have triggered this reaction. He admits to panicky feelings. He has tried deep breathing techniques without help.  Past Medical History: Patient Active Problem List   Diagnosis Date Noted   Lateral epicondylitis of right elbow 03/10/2022   Elevated BP without diagnosis of hypertension 03/10/2022   Low libido 12/03/2020   Elevated TSH 10/27/2020   Borderline hyperlipidemia 09/06/2020   Baker's cyst of knee, left 10/24/2019   GERD without esophagitis 07/25/2019   Generalized anxiety disorder 12/04/2018   Past Surgical History:  Procedure Laterality Date   Torn eye muscle Left 2008   Family History  Family history unknown: Yes   Outpatient Medications Prior to Visit  Medication Sig Dispense Refill   hydrOXYzine (ATARAX) 10 MG tablet Take 1 tablet (10 mg total) by mouth 3 (three) times daily as needed (anxiety). 30 tablet 2   omeprazole (PRILOSEC) 20 MG capsule Take 20 mg by mouth daily.     venlafaxine XR (EFFEXOR-XR) 75 MG 24 hr capsule Take 1 capsule (75 mg total) by mouth daily with breakfast. 90 capsule 3   No facility-administered  medications prior to visit.   Allergies  Allergen Reactions   Paroxetine Hcl     Cramps, muscles spasm, dizziness     Objective:   Today's Vitals   07/05/22 0924  BP: 118/66  Pulse: 73  Temp: 98.7 F (37.1 C)  TempSrc: Temporal  SpO2: 98%  Weight: 187 lb 9.6 oz (85.1 kg)  Height: 6' 2"$  (1.88 m)   Body mass index is 24.09 kg/m.   General: Well developed, well nourished. No acute distress. Psych: Alert and oriented. Normal mood and affect.  Health Maintenance Due  Topic Date Due   COLONOSCOPY (Pts 45-1yr Insurance coverage will need to be confirmed)  Never done      07/05/2022   10:34 AM 03/10/2022    8:10 AM 09/06/2020   10:02 AM  Depression screen PHQ 2/9  Decreased Interest 0 0 0  Down, Depressed, Hopeless 0 0 0  PHQ - 2 Score 0 0 0  Altered sleeping 0    Tired, decreased energy 0    Change in appetite 0    Feeling bad or failure about yourself  0    Trouble concentrating 0    Moving slowly or fidgety/restless 0    Suicidal thoughts 0    PHQ-9 Score 0    Difficult doing work/chores Not difficult at all        07/05/2022   10:34 AM 07/27/2021    1:58 PM 01/29/2020   11:09 AM 10/24/2019    3:47 PM  GAD 7 : Generalized Anxiety Score  Nervous, Anxious, on Edge 0 0 0 1  Control/stop worrying  0 0 0  Worry too much - different things 0 0 0 0  Trouble relaxing 0 0 0 0  Restless 1 0 0 0  Easily annoyed or irritable 0 0 0 0  Afraid - awful might happen 0 0 0 0  Total GAD 7 Score  0 0 1  Anxiety Difficulty Not difficult at all Not difficult at all Not difficult at all Not difficult at all    Assessment & Plan:   Problem List Items Addressed This Visit       Other   Generalized anxiety disorder - Primary    Ronald Ronald [name] anxiety is still managed well, but he is having some breakthrough acute panic attacks. I will try adding alprazolam for acute episodes. We discussed the importance of him using this judiciously. I will plan to reassess him in 6 weeks.        Relevant Medications   ALPRAZolam (XANAX) 0.25 MG tablet   Return in about 6 weeks (around 08/16/2022) for Reassessment.   Haydee Salter, MD

## 2022-07-05 NOTE — Assessment & Plan Note (Signed)
Ronald [name] anxiety is still managed well, but he is having some breakthrough acute panic attacks. I will try adding alprazolam for acute episodes. We discussed the importance of him using this judiciously. I will plan to reassess him in 6 weeks.

## 2022-08-01 ENCOUNTER — Other Ambulatory Visit: Payer: Self-pay | Admitting: Family Medicine

## 2022-08-01 DIAGNOSIS — F411 Generalized anxiety disorder: Secondary | ICD-10-CM

## 2023-07-17 ENCOUNTER — Ambulatory Visit: Admitting: Family Medicine

## 2023-07-17 ENCOUNTER — Encounter: Payer: Self-pay | Admitting: Family Medicine

## 2023-07-17 VITALS — BP 132/80 | HR 81 | Temp 98.4°F | Ht 74.0 in | Wt 190.2 lb

## 2023-07-17 DIAGNOSIS — R030 Elevated blood-pressure reading, without diagnosis of hypertension: Secondary | ICD-10-CM

## 2023-07-17 DIAGNOSIS — R7989 Other specified abnormal findings of blood chemistry: Secondary | ICD-10-CM

## 2023-07-17 DIAGNOSIS — F411 Generalized anxiety disorder: Secondary | ICD-10-CM | POA: Diagnosis not present

## 2023-07-17 MED ORDER — VENLAFAXINE HCL ER 150 MG PO CP24
150.0000 mg | ORAL_CAPSULE | Freq: Every day | ORAL | 3 refills | Status: AC
Start: 2023-07-17 — End: ?

## 2023-07-17 MED ORDER — ALPRAZOLAM 0.25 MG PO TABS: 0.2500 mg | ORAL_TABLET | Freq: Two times a day (BID) | ORAL | 0 refills | Status: AC | PRN

## 2023-07-17 NOTE — Progress Notes (Signed)
 [facility] [address] [place] [address] Dept: [phone] Dept Fax: [phone]  Chronic Care Office Visit  Subjective:    Patient ID: Ronald [name], male    DOB: 1970-07-28, 53 y.o..   MRN: [id number]  Chief Complaint  Patient presents with   Anxiety    F/u anxiety and med refills.     History of Present Illness:  Patient is in today for reassessment of chronic medical issues.  Patient is in today noting a recent flare of his anxiety. Ronald [name] has a history of generalized anxiety. This had been well controlled on venlafaxine 75 mg daily. He notes that over the past 2 weeks, he has been having acute episodes of anxiety associated with shifting pains in his trunk. He had an almost identical presentation in Feb. 2024. He feels currently like maybe his meds are suddenly not working. last year, we added a short course of alprazolam to manage acute anxiety issues.   Past Medical History: Patient Active Problem List   Diagnosis Date Noted   Lateral epicondylitis of right elbow 03/10/2022   Elevated BP without diagnosis of hypertension 03/10/2022   Low libido 12/03/2020   Elevated TSH 10/27/2020   Borderline hyperlipidemia 09/06/2020   Baker's cyst of knee, left 10/24/2019   GERD without esophagitis 07/25/2019   Generalized anxiety disorder 12/04/2018   Past Surgical History:  Procedure Laterality Date   Torn eye muscle Left 2008   Family History  Family history unknown: Yes   Outpatient Medications Prior to Visit  Medication Sig Dispense Refill   omeprazole (PRILOSEC) 20 MG capsule Take 20 mg by mouth daily.     venlafaxine XR (EFFEXOR-XR) 75 MG 24 hr capsule TAKE 1 CAPSULE BY MOUTH DAILY WITH BREAKFAST. 90 capsule 3   ALPRAZolam (XANAX) 0.25 MG tablet Take 1 tablet (0.25 mg total) by mouth 2 (two) times daily as needed for anxiety. (Patient not taking: Reported on 07/17/2023) 20 tablet 0    hydrOXYzine (ATARAX) 10 MG tablet Take 1 tablet (10 mg total) by mouth 3 (three) times daily as needed (anxiety). 30 tablet 2   No facility-administered medications prior to visit.   Allergies  Allergen Reactions   Paroxetine Hcl     Cramps, muscles spasm, dizziness   Objective:   Today's Vitals   07/17/23 1546 07/17/23 1609 07/17/23 1634  BP: (!) 146/84 (!) 144/86 132/80  Pulse: 81    Temp: 98.4 F (36.9 C)    TempSrc: Temporal    SpO2: 99%    Weight: 190 lb 3.2 oz (86.3 kg)    Height: 6\' 2"  (1.88 m)     Body mass index is 24.42 kg/m.   General: Well developed, well nourished. No acute distress. Psych: Alert and oriented. Normal mood and affect.  Health Maintenance Due  Topic Date Due   Colonoscopy  Never done   Zoster Vaccines- Shingrix (1 of 2) Never done     Assessment & Plan:   Problem List Items Addressed This Visit       Other   Elevated BP without diagnosis of hypertension   Initial BP was elevated. I will check labs to screen for potential causes.      Relevant Orders   TSH   Comprehensive metabolic panel   Elevated TSH   Past history of thyroid test abnormalities. I will reassess to make sure there is not an increase in thyroid hormone mimicking panic.      Relevant Orders  TSH   Generalized anxiety disorder - Primary   Ronald [name] had an almost identical presentation last year in late Feb. This raises the possibility that his issue is not ineffectiveness of his medicine, but some sort of anniversary reaction.  I will try increasing his venlafaxine to 150 mg daily. I will renew a limited amount of alprazolam for acute episodes. I will plan to reassess him in 6 weeks.       Relevant Medications   venlafaxine XR (EFFEXOR-XR) 150 MG 24 hr capsule   ALPRAZolam (XANAX) 0.25 MG tablet   Other Relevant Orders   CBC   TSH   T4, free   Comprehensive metabolic panel    Return in about 6 weeks (around 08/28/2023) for Reassessment.   Loyola Mast, MD

## 2023-07-17 NOTE — Assessment & Plan Note (Signed)
 Ronald [name] had an almost identical presentation last year in late Feb. This raises the possibility that his issue is not ineffectiveness of his medicine, but some sort of anniversary reaction.  I will try increasing his venlafaxine to 150 mg daily. I will renew a limited amount of alprazolam for acute episodes. I will plan to reassess him in 6 weeks.

## 2023-07-17 NOTE — Assessment & Plan Note (Signed)
 Initial BP was elevated. I will check labs to screen for potential causes.

## 2023-07-17 NOTE — Assessment & Plan Note (Signed)
 Past history of thyroid test abnormalities. I will reassess to make sure there is not an increase in thyroid hormone mimicking panic.

## 2023-07-18 ENCOUNTER — Encounter: Payer: Self-pay | Admitting: Family Medicine

## 2023-07-18 LAB — COMPREHENSIVE METABOLIC PANEL
ALT: 14 U/L (ref 0–53)
AST: 19 U/L (ref 0–37)
Albumin: 4.3 g/dL (ref 3.5–5.2)
Alkaline Phosphatase: 88 U/L (ref 39–117)
BUN: 21 mg/dL (ref 6–23)
CO2: 29 meq/L (ref 19–32)
Calcium: 9.5 mg/dL (ref 8.4–10.5)
Chloride: 103 meq/L (ref 96–112)
Creatinine, Ser: 0.89 mg/dL (ref 0.40–1.50)
GFR: 99.56 mL/min (ref >60.00)
Glucose, Bld: 95 mg/dL (ref 70–99)
Potassium: 4.4 meq/L (ref 3.5–5.1)
Sodium: 139 meq/L (ref 135–145)
Total Bilirubin: 0.4 mg/dL (ref 0.2–1.2)
Total Protein: 6.8 g/dL (ref 6.0–8.3)

## 2023-07-18 LAB — CBC
HCT: 41.2 % (ref 39.0–52.0)
Hemoglobin: 13.7 g/dL (ref 13.0–17.0)
MCHC: 33.3 g/dL (ref 30.0–36.0)
MCV: 91.8 fl (ref 78.0–100.0)
Platelets: 316 10*3/uL (ref 150.0–400.0)
RBC: 4.49 Mil/uL (ref 4.22–5.81)
RDW: 12.9 % (ref 11.5–15.5)
WBC: 7.5 10*3/uL (ref 4.0–10.5)

## 2023-07-18 LAB — TSH: TSH: 3.06 u[IU]/mL (ref 0.35–5.50)

## 2023-07-18 LAB — T4, FREE: Free T4: 0.91 ng/dL (ref 0.60–1.60)

## 2023-08-21 ENCOUNTER — Encounter: Payer: Self-pay | Admitting: Family Medicine

## 2023-08-21 ENCOUNTER — Ambulatory Visit: Admitting: Family Medicine

## 2023-08-21 VITALS — BP 126/82 | HR 72 | Temp 98.7°F | Ht 74.0 in | Wt 188.6 lb

## 2023-08-21 DIAGNOSIS — F411 Generalized anxiety disorder: Secondary | ICD-10-CM | POA: Diagnosis not present

## 2023-08-21 DIAGNOSIS — Z9189 Other specified personal risk factors, not elsewhere classified: Secondary | ICD-10-CM | POA: Diagnosis not present

## 2023-08-21 NOTE — Assessment & Plan Note (Signed)
 Improved. Continue venlafaxine to 150 mg daily. His GI issues could related to the venlafaxine, but may improve with a bit more time. We will monitor for now. If not improving, I would consider reducing his dose again. I discussed with Ronald [name] how he had an almost identical presentation last year in late Feb, raising the possibility that he is having an anniversary reaction. I recommend he be mindful of this next winter.

## 2023-08-21 NOTE — Progress Notes (Signed)
 [facility] [address] [place] [address] Dept: [phone] Dept Fax: [phone]  Chronic Care Office Visit  Subjective:    Patient ID: Ronald [name], male    DOB: [birth year], 53 y.o..   MRN: [id number]  Chief Complaint  Patient presents with   Anxiety    6 week f/u anxiety.  No concerns.     History of Present Illness:  Patient is in today for reassessment of chronic medical issues.  Mr. Spurgeon is in today for reassessment of his anxiety. I had seen him about a month ago with a recent flare of his anxiety. He has a history of generalized anxiety. This had been well controlled on venlafaxine 75 mg daily. He noted that over the previous 2 weeks, he had been having acute episodes of anxiety associated with shifting pains in his trunk. He had an almost identical presentation in Feb. 2024. We increased his venlafaxine to 150 mg daily and I added a short course of alprazolam. He notes his anxiety is improved at this point. He has noted some GI upset that he relates to his medicine change.  Mr. Detwiler notes his wife has been concerned about his snoring. She has noted abnormal breathing and short apneic spells when he sleeps. He notes that he never feels like he has had a good nights sleep. He denies any daytime drowsiness generally.  Past Medical History: Patient Active Problem List   Diagnosis Date Noted   Lateral epicondylitis of right elbow 03/10/2022   Low libido 12/03/2020   Elevated TSH 10/27/2020   Borderline hyperlipidemia 09/06/2020   Baker's cyst of knee, left 10/24/2019   GERD without esophagitis 07/25/2019   Generalized anxiety disorder 12/04/2018   Past Surgical History:  Procedure Laterality Date   Torn eye muscle Left 2008   Family History  Family history unknown: Yes   Outpatient Medications Prior to Visit  Medication Sig Dispense Refill   ALPRAZolam (XANAX) 0.25 MG tablet Take 1  tablet (0.25 mg total) by mouth 2 (two) times daily as needed for anxiety. 20 tablet 0   omeprazole (PRILOSEC) 20 MG capsule Take 20 mg by mouth daily.     venlafaxine XR (EFFEXOR-XR) 150 MG 24 hr capsule Take 1 capsule (150 mg total) by mouth daily with breakfast. 90 capsule 3   No facility-administered medications prior to visit.   Allergies  Allergen Reactions   Paroxetine Hcl     Cramps, muscles spasm, dizziness   Objective:   Today's Vitals   08/21/23 1052  BP: 126/82  Pulse: 72  Temp: 98.7 F (37.1 C)  TempSrc: Temporal  SpO2: 100%  Weight: 188 lb 9.6 oz (85.5 kg)  Height: 6\' 2"  (1.88 m)   Body mass index is 24.21 kg/m.   Neck circumference: 38 cm  General: Well developed, well nourished. No acute distress. Psych: Alert and oriented. Normal mood and affect.  Health Maintenance Due  Topic Date Due   Colonoscopy  Never done   Zoster Vaccines- Shingrix (1 of 2) Never done   STOP-Bang Score for Sleep Apnea Screening  Patient Self-Reported Questions         Score Do you snore loudly? (louder than  1  talking or sufficiently loud to be  heard through doors)  Do you often feel tired, fatigued, or  0  sleepy during the daytime?  Has anyone observed you stop breathing 1  during sleep?  Do you have (or are you being treated for)  0  high blood pressure?  Clinical Information          Score BMI>35 kg/m2     0  Age > 50 years    1  Neck circumference > 40 cm   0  Gender (male)     1   Total      4  A score <3 indicates a low risk of sleep apnea.     Assessment & Plan:   Problem List Items Addressed This Visit       Other   Generalized anxiety disorder - Primary   Improved. Continue venlafaxine to 150 mg daily. His GI issues could related to the venlafaxine, but may improve with a bit more time. We will monitor for now. If not improving, I would consider reducing his dose again. I discussed with Mr. Ricciuti how he had an almost identical  presentation last year in late Feb, raising the possibility that he is having an anniversary reaction. I recommend he be mindful of this next winter.      Other Visit Diagnoses       At risk for sleep apnea       I will refer him for a sleep study or home sleep eval.   Relevant Orders   Ambulatory referral to Sleep Studies       Return in about 6 months (around 02/20/2024) for Reassessment.   Loyola Mast, MD

## 2023-09-26 ENCOUNTER — Ambulatory Visit: Admitting: Neurology

## 2023-09-26 ENCOUNTER — Encounter: Payer: Self-pay | Admitting: Neurology

## 2023-09-26 VITALS — BP 127/87 | HR 79 | Ht 74.0 in | Wt 185.0 lb

## 2023-09-26 DIAGNOSIS — R0683 Snoring: Secondary | ICD-10-CM | POA: Diagnosis not present

## 2023-09-26 DIAGNOSIS — R351 Nocturia: Secondary | ICD-10-CM | POA: Diagnosis not present

## 2023-09-26 DIAGNOSIS — R0681 Apnea, not elsewhere classified: Secondary | ICD-10-CM

## 2023-09-26 DIAGNOSIS — Z9189 Other specified personal risk factors, not elsewhere classified: Secondary | ICD-10-CM | POA: Diagnosis not present

## 2023-09-26 DIAGNOSIS — G4726 Circadian rhythm sleep disorder, shift work type: Secondary | ICD-10-CM

## 2023-09-26 DIAGNOSIS — R519 Headache, unspecified: Secondary | ICD-10-CM

## 2023-09-26 NOTE — Progress Notes (Signed)
 Subjective:    Patient ID: Ronald [name] is a 53 y.o. male.  HPI    Debbra Fairy, MD, PhD [facility] [address], [address] [address] [place], [place] [address]  Dear Dr. Therese Flash,  I saw your patient, Ronald [name], upon your kind request in my sleep clinic today for initial consultation of his sleep disorder, in particular, concern for underlying obstructive sleep apnea.  The patient is unaccompanied today.  As you know, Ronald [name] is a 53 year old male with an underlying medical history of reflux disease, anxiety, borderline hyperlipidemia, and elevated TSH, who reports snoring and excessive daytime somnolence as well as witnessed apneas, per wife's report.  His Epworth sleepiness score is 3 out of 24, fatigue severity score is 20 out of 63.  I reviewed your office note from 08/21/2023. His snoring is loud and disturbing to his wife. He typically sleeps on his sides or sometimes on his stomach.  He is not a back sleeper.  He has tried Breathe Right strips with some decrease in snoring.  He thought about trying an over-the-counter mouth appliance but did not end up getting 1.  He lives with his wife, his daughter and 27-year-old grandson.  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  He does not always get 7 hours of sleep, sleep is interrupted.  He has nocturia about once on average, he has had occasional or rare headaches upon awakening.  He has a TV in the bedroom but turned it off before falling asleep and has blackout curtains for daytime sleep.  He has 1 dog in the household and the dog does not sleep with him.  He drinks quite a bit of caffeine in the form of coffee, about 4 cups and 2 cans of soda.  He drinks alcohol rarely, he quit smoking some 15 years ago.  Weight has been more or less stable for years.  His Past Medical History Is  Significant For: Past Medical History:  Diagnosis Date   Anxiety 12/04/2018   GERD without esophagitis 07/25/2019    His Past Surgical History Is Significant For: Past Surgical History:  Procedure Laterality Date   Torn eye muscle Left 2008    His Family History Is Significant For: Family History  Problem Relation Age of Onset   Panic disorder Sister     His Social History Is Significant For: Social History   Socioeconomic History   Marital status: Married    Spouse name: Mylinda Asa   Number of children: 0   Years of education: 12   Highest education level: Not on file  Occupational History   Occupation: Production designer, theatre/television/film  Tobacco Use   Smoking status: Former    Current packs/day: 0.00    Average packs/day: 0.5 packs/day for 10.0 years (5.0 ttl pk-yrs)    Types: Cigarettes    Start date: 06/16/2001    Quit date: 06/17/2011    Years since quitting: 12.2   Smokeless tobacco: Never  Vaping Use   Vaping status: Never Used  Substance and Sexual Activity   Alcohol use: Yes    Comment: rarely   Drug use: No   Sexual activity: Yes  Other Topics Concern   Not on file  Social History Narrative   Caffiene 12oz keurig 4 daily, soda 2-3   Lives wife, 1 dog   Work rotating shifts (90yr) [employer]   Social  Drivers of Corporate investment banker Strain: Not on file  Food Insecurity: Not on file  Transportation Needs: Not on file  Physical Activity: Not on file  Stress: Not on file  Social Connections: Not on file    His Allergies Are:  Allergies  Allergen Reactions   Paroxetine  Hcl     Cramps, muscles spasm, dizziness  :   His Current Medications Are:  Outpatient Encounter Medications as of 09/26/2023  Medication Sig   ALPRAZolam  (XANAX ) 0.25 MG tablet Take 1 tablet (0.25 mg total) by mouth 2 (two) times daily as needed for anxiety.   omeprazole (PRILOSEC) 20 MG capsule Take 20 mg by mouth daily.   venlafaxine  XR (EFFEXOR -XR) 150 MG 24 hr capsule Take 1 capsule (150 mg  total) by mouth daily with breakfast.   No facility-administered encounter medications on file as of 09/26/2023.  :   Review of Systems:  Out of a complete 14 point review of systems, all are reviewed and negative with the exception of these symptoms as listed below:  Review of Systems  Neurological:        Snoring, witnessed periods of apnea.  No sleep study.  ESS 3 FSS 20.    Objective:  Neurological Exam  Physical Exam Physical Examination:   Vitals:   09/26/23 0845  BP: 127/87  Pulse: 79    General Examination: The patient is a very pleasant 53 y.o. male in no acute distress. He appears well-developed and well-nourished and well groomed.   HEENT: Normocephalic, atraumatic, pupils are equal, round and reactive to light, extraocular tracking is good without limitation to gaze excursion or nystagmus noted. Hearing is grossly intact. Face is symmetric with normal facial animation. Speech is clear with no dysarthria noted. There is no hypophonia. There is no lip, neck/head, jaw or voice tremor. Neck is supple with full range of passive and active motion. There are no carotid bruits on auscultation. Oropharynx exam reveals: mild mouth dryness, adequate dental hygiene and mild to moderate airway crowding, due to small airway entry and wider tongue, tonsils on the smaller side, about 1+ bilaterally.  Neck circumference 15 and 1 eighths inches, mild overbite noted.  Tongue protrudes centrally and palate elevates symmetrically, nasal inspection reveals slight deviation of the septum to the left.  Mallampati class III.  Chest: Clear to auscultation without wheezing, rhonchi or crackles noted.  Heart: S1+S2+0, regular and normal without murmurs, rubs or gallops noted.   Abdomen: Soft, non-tender and non-distended.  Extremities: There is no pitting edema in the distal lower extremities bilaterally.   Skin: Warm and dry without trophic changes noted.   Musculoskeletal: exam reveals no  obvious joint deformities.   Neurologically:  Mental status: The patient is awake, alert and oriented in all 4 spheres. His immediate and remote memory, attention, language skills and fund of knowledge are appropriate. There is no evidence of aphasia, agnosia, apraxia or anomia. Speech is clear with normal prosody and enunciation. Thought process is linear. Mood is normal and affect is normal.  Cranial nerves II - XII are as described above under HEENT exam.  Motor exam: Normal bulk, strength and tone is noted. There is no obvious action or resting tremor.  Fine motor skills and coordination: grossly intact.  Cerebellar testing: No dysmetria or intention tremor. There is no truncal or gait ataxia.  Sensory exam: intact to light touch in the upper and lower extremities.  Gait, station and balance: He stands easily. No veering to one  side is noted. No leaning to one side is noted. Posture is age-appropriate and stance is narrow based. Gait shows normal stride length and normal pace. No problems turning are noted.   Assessment and Plan:  In summary, Ronald [name] is a 53 year old male with an underlying medical history of reflux disease, anxiety, borderline hyperlipidemia, and elevated TSH, whose history and physical exam are concerning for sleep disordered breathing, particularly obstructive sleep apnea (OSA). A laboratory attended sleep study is typically considered "gold standard" for evaluation of sleep disordered breathing.   I had a long chat with the patient about my findings and the diagnosis of sleep apnea, particularly OSA, its prognosis and treatment options. We talked about medical/conservative treatments, surgical interventions and non-pharmacological approaches for symptom control. I explained, in particular, the risks and ramifications of untreated moderate to severe OSA, especially with respect to developing cardiovascular disease down the road, including congestive heart failure  (CHF), difficult to treat hypertension, cardiac arrhythmias (particularly A-fib), neurovascular complications including TIA, stroke and dementia. Even type 2 diabetes has, in part, been linked to untreated OSA. Symptoms of untreated OSA may include (but may not be limited to) daytime sleepiness, nocturia (i.e. frequent nighttime urination), memory problems, mood irritability and suboptimally controlled or worsening mood disorder such as depression and/or anxiety, lack of energy, lack of motivation, physical discomfort, as well as recurrent headaches, especially morning or nocturnal headaches. We talked about the importance of maintaining a healthy lifestyle and striving for healthy weight. In addition, we talked about the importance of striving for and maintaining good sleep hygiene.  His sleep is interrupted primarily secondary to shift work. I recommended a sleep study at this time. I outlined the differences between a laboratory attended sleep study which is considered more comprehensive and accurate over the option of a home sleep test (HST); the latter may lead to underestimation of sleep disordered breathing in some instances and does not help with diagnosing upper airway resistance syndrome and is not accurate enough to diagnose primary central sleep apnea typically.  We mutually agreed to proceed with a home sleep test at this time, particularly given his variable sleep schedule. I outlined possible surgical and non-surgical treatment options of OSA, including the use of a positive airway pressure (PAP) device (i.e. CPAP, AutoPAP/APAP or BiPAP in certain circumstances), a custom-made dental device (aka oral appliance, which would require a referral to a specialist dentist or orthodontist typically, and is generally speaking not considered for patients with full dentures or edentulous state), upper airway surgical options, such as traditional UPPP (which is not considered a first-line treatment) or the  Inspire device (hypoglossal nerve stimulator, which would involve a referral for consultation with an ENT surgeon, after careful selection, following inclusion criteria - also not first-line treatment). I explained the PAP treatment option to the patient in detail, as this is generally considered first-line treatment.  The patient indicated that he would be willing to try PAP therapy, if the need arises. I explained the importance of being compliant with PAP treatment, not only for insurance purposes but primarily to improve patient's symptoms symptoms, and for the patient's long term health benefit, including to reduce His cardiovascular risks longer-term.    We will pick up our discussion about the next steps and treatment options after testing.  We will keep him posted as to the test results by phone call and/or MyChart messaging where possible.  We will plan to follow-up in sleep clinic accordingly as well.  I answered all  his questions today and the patient was in agreement.   I encouraged him to call with any interim questions, concerns, problems or updates or email us  through MyChart.  Generally speaking, sleep test authorizations may take up to 2 weeks, sometimes less, sometimes longer, the patient is encouraged to get in touch with us  if they do not hear back from the sleep lab staff directly within the next 2 weeks.  Thank you very much for allowing me to participate in the care of this nice patient. If I can be of any further assistance to you please do not hesitate to call me at [phone].  Sincerely,   Debbra Fairy, MD, PhD

## 2023-09-26 NOTE — Patient Instructions (Signed)

## 2023-10-05 ENCOUNTER — Ambulatory Visit: Admitting: Neurology

## 2023-10-05 DIAGNOSIS — R351 Nocturia: Secondary | ICD-10-CM

## 2023-10-05 DIAGNOSIS — G4733 Obstructive sleep apnea (adult) (pediatric): Secondary | ICD-10-CM

## 2023-10-05 DIAGNOSIS — R0681 Apnea, not elsewhere classified: Secondary | ICD-10-CM

## 2023-10-05 DIAGNOSIS — G4726 Circadian rhythm sleep disorder, shift work type: Secondary | ICD-10-CM

## 2023-10-05 DIAGNOSIS — R0683 Snoring: Secondary | ICD-10-CM

## 2023-10-05 DIAGNOSIS — R519 Headache, unspecified: Secondary | ICD-10-CM

## 2023-10-05 DIAGNOSIS — Z9189 Other specified personal risk factors, not elsewhere classified: Secondary | ICD-10-CM

## 2023-11-01 NOTE — Progress Notes (Signed)
 See procedure note.

## 2023-11-02 ENCOUNTER — Ambulatory Visit: Payer: Self-pay | Admitting: Neurology

## 2023-11-02 DIAGNOSIS — G4733 Obstructive sleep apnea (adult) (pediatric): Secondary | ICD-10-CM

## 2023-11-02 NOTE — Procedures (Signed)
 [facility]  HOME SLEEP TEST (SANSA) REPORT (Mail-Out Device):   STUDY DATE: 10/10/2023  DOB: 08-30-1970  MRN: [id number]  ORDERING CLINICIAN: Debbra Fairy, MD, PhD   REFERRING CLINICIAN: Graig Lawyer, MD   CLINICAL INFORMATION/HISTORY: 53 year old Ronald [name] with an underlying medical history of reflux disease, anxiety, borderline hyperlipidemia, and elevated TSH, who reports snoring and excessive daytime somnolence as well as witnessed apneas.   PATIENT'S LAST REPORTED EPWORTH SLEEPINESS SCORE (ESS): 3/24.  BMI (at the time of sleep clinic visit and/or test date): 23.Ronald kg/m  FINDINGS:   Study Protocol:    The SANSA single-point-of-skin-contact chest-worn sensor - an FDA cleared and DOT approved type 4 home sleep test device - measures eight physiological channels,  including blood oxygen saturation (measured via PPG [photoplethysmography]), EKG-derived heart rate, respiratory effort, chest movement (measured via accelerometer), snoring, body position, and actigraphy. The device is designed to be worn for up to 10 hours per study.   Sleep Summary:   Total Recording Time (hours, min): 8 hours, 32 min  Total Effective Sleep Time (hours, min):  Ronald hours, 18 min  Sleep Efficiency (%):    87%   Respiratory Indices:   Calculated sAHI (per hour):  Ronald.3/hour         Oxygen Saturation Statistics:    Oxygen Saturation (%) Mean: 95.1%   Minimum oxygen saturation (%):                 82.6%   O2 Saturation Range (%): 82.6-99.1%   Time below or at 88% saturation: 0 min   Pulse Rate Statistics:   Pulse Mean (bpm):    67/min    Pulse Range (52-107/min)   Snoring: Mild to moderate  IMPRESSION/DIAGNOSES:   OSA (obstructive sleep apnea), mild   RECOMMENDATIONS:   This home sleep test demonstrates overall mild obstructive sleep apnea with a total AHI of Ronald.3/hour and O2 nadir of 82.6%. Snoring was detected, in the mild to moderate range. Given the patient's  medical history and sleep related complaints, therapy with a positive airway pressure device is recommended. Treatment can be achieved in the form of autoPAP trial/titration at home for now. A full night, in-lab PAP titration study may aid in improving proper treatment settings and with mask fit, if needed, down the road. Alternative treatments may include weight loss (where appropriate) along with avoidance of the supine sleep position (if possible), or an oral appliance in appropriate candidates.   Please note that untreated obstructive sleep apnea may carry additional perioperative morbidity. Patients with significant obstructive sleep apnea should receive perioperative PAP therapy and the surgeons and particularly the anesthesiologist should be informed of the diagnosis and the severity of the sleep disordered breathing. The patient should be cautioned not to drive, work at heights, or operate dangerous or heavy equipment when tired or sleepy. Review and reiteration of good sleep hygiene measures should be pursued with any patient. Other causes of the patient's symptoms, including circadian rhythm disturbances, an underlying mood disorder, medication effect and/or an underlying medical problem cannot be ruled out based on this test. Clinical correlation is recommended.  The patient and his referring provider will be notified of the test results. The patient will be seen in follow up in sleep clinic at [facility], as necessary.  I certify that I have reviewed the raw data recording prior to the issuance of this report in accordance with the standards of the American Academy of Sleep Medicine (AASM).    INTERPRETING PHYSICIAN:  Debbra Fairy, MD, PhD Medical Director, [facility]) Diplomat, ABPN (Neurology and Sleep)   [facility] [address], [address] [place], [place] [address] [phone]

## 2023-11-13 ENCOUNTER — Encounter: Payer: Self-pay | Admitting: Neurology

## 2023-11-26 NOTE — Telephone Encounter (Signed)
 Sent order to [facility] (for autopap).

## 2023-11-27 NOTE — Telephone Encounter (Signed)
 RE: autopap therapy new user Received: Today New, Adine Neysa Nena GORMAN, RN; New, Bradley; Cain, Mitchell; Garcia, Patricia; Ziegler, Melissa; 1 other Received, Thank you!     Previous Messages    ----- Message ----- From: Neysa Nena GORMAN, RN Sent: 11/26/2023  10:00 AM EDT To: Adine Leer; Avelina Sprung; Ephraim Dollar* Subject: autopap therapy new user                      See new order in EPIC for pt  New autopap user  Velmer Woelfel Male, 53 y.o., November 19, 1970 MRN: [id number] Phone: [phone]  Thank you  Particia

## 2024-01-31 ENCOUNTER — Encounter: Payer: Self-pay | Admitting: Neurology

## 2024-02-01 NOTE — Telephone Encounter (Signed)
 FYI-Pt returned call. Pt is not able to come in on the original date that was offered. Due to compliance end date and his work schedule hosp f/u slot had to be used in order for pt to be compliant.

## 2024-02-01 NOTE — Telephone Encounter (Signed)
 Setup 12/10/2023 (appt needed 01/10/24 - 03/11/24). Please contact patient and schedule appt with Dr Buck or any nurse practitioner during this window.

## 2024-02-01 NOTE — Telephone Encounter (Signed)
 FYI-Called pt and put an appt on hold for him on 10/13 with NP Amy L. Pt will call back to confirm after he checks his work schedule.

## 2024-02-01 NOTE — Telephone Encounter (Signed)
 Ok thank you

## 2024-02-04 NOTE — Patient Instructions (Signed)

## 2024-02-04 NOTE — Progress Notes (Unsigned)
 No chief complaint on file.   HISTORY OF PRESENT ILLNESS:  02/04/24 ALL:  Ronald [name] is a 53 y.o. male here today for follow up for recently diagnosed OSA started on CPAP therapy. HST 09/2023 showed mild obstructive sleep apnea with a total AHI of 7.3/hour and O2 nadir of 82.6%. AutoPAP advised. He received his machine 12/10/2023 thorugh [facility]. Since,    HISTORY (copied from Dr Obie previous note)  Dear Dr. Thedora,   I saw your patient, Ronald [name], upon your kind request in my sleep clinic today for initial consultation of his sleep disorder, in particular, concern for underlying obstructive sleep apnea.  The patient is unaccompanied today.  As you know, Ronald [name] is a 53 year old male with an underlying medical history of reflux disease, anxiety, borderline hyperlipidemia, and elevated TSH, who reports snoring and excessive daytime somnolence as well as witnessed apneas, per wife's report.  His Epworth sleepiness score is 3 out of 24, fatigue severity score is 20 out of 63.  I reviewed your office note from 08/21/2023. His snoring is loud and disturbing to his wife. He typically sleeps on his sides or sometimes on his stomach.  He is not a back sleeper.  He has tried Breathe Right strips with some decrease in snoring.  He thought about trying an over-the-counter mouth appliance but did not end up getting 1.  He lives with his wife, his daughter and 1-year-old grandson.  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  He does not always get 7 hours of sleep, sleep is interrupted.  He has nocturia about once on average, he has had occasional or rare headaches upon awakening.  He has a TV in the bedroom but turned it off before falling asleep and has blackout curtains for daytime sleep.  He has 1 dog in the household and the dog does not sleep with him.  He drinks  quite a bit of caffeine in the form of coffee, about 4 cups and 2 cans of soda.  He drinks alcohol rarely, he quit smoking some 15 years ago.  Weight has been more or less stable for years   REVIEW OF SYSTEMS: Out of a complete 14 system review of symptoms, the patient complains only of the following symptoms, and all other reviewed systems are negative.   ALLERGIES: Allergies  Allergen Reactions   Paroxetine  Hcl     Cramps, muscles spasm, dizziness     HOME MEDICATIONS: Outpatient Medications Prior to Visit  Medication Sig Dispense Refill   ALPRAZolam  (XANAX ) 0.25 MG tablet Take 1 tablet (0.25 mg total) by mouth 2 (two) times daily as needed for anxiety. 20 tablet 0   omeprazole (PRILOSEC) 20 MG capsule Take 20 mg by mouth daily.     venlafaxine  XR (EFFEXOR -XR) 150 MG 24 hr capsule Take 1 capsule (150 mg total) by mouth daily with breakfast. 90 capsule 3   No facility-administered medications prior to visit.     PAST MEDICAL HISTORY: Past Medical History:  Diagnosis Date   Anxiety 12/04/2018   GERD without esophagitis 07/25/2019     PAST SURGICAL HISTORY: Past Surgical History:  Procedure Laterality Date   Torn eye muscle Left 2008     FAMILY HISTORY: Family History  Problem Relation Age of Onset   Panic disorder Sister      SOCIAL HISTORY: Social History   Socioeconomic History  Marital status: Married    Spouse name: Ronald [name]   Number of children: 0   Years of education: 12   Highest education level: Not on file  Occupational History   Occupation: Production designer, theatre/television/film  Tobacco Use   Smoking status: Former    Current packs/day: 0.00    Average packs/day: 0.5 packs/day for 10.0 years (5.0 ttl pk-yrs)    Types: Cigarettes    Start date: 06/16/2001    Quit date: 06/17/2011    Years since quitting: 12.6   Smokeless tobacco: Never  Vaping Use   Vaping status: Never Used  Substance and Sexual Activity   Alcohol use: Yes    Comment: rarely   Drug use: No   Sexual  activity: Yes  Other Topics Concern   Not on file  Social History Narrative   Caffiene 12oz keurig 4 daily, soda 2-3   Lives wife, 1 dog   Work rotating shifts (39yr) [employer]   Social Drivers of Corporate investment banker Strain: Not on file  Food Insecurity: Not on file  Transportation Needs: Not on file  Physical Activity: Not on file  Stress: Not on file  Social Connections: Not on file  Intimate Partner Violence: Not on file     PHYSICAL EXAM  There were no vitals filed for this visit. There is no height or weight on file to calculate BMI.  Generalized: Well developed, in no acute distress  Cardiology: normal rate and rhythm, no murmur auscultated  Respiratory: clear to auscultation bilaterally    Neurological examination  Mentation: Alert oriented to time, place, history taking. Follows all commands speech and language fluent Cranial nerve II-XII: Pupils were equal round reactive to light. Extraocular movements were full, visual field were full on confrontational test. Facial sensation and strength were normal. Uvula tongue midline. Head turning and shoulder shrug  were normal and symmetric. Motor: The motor testing reveals 5 over 5 strength of all 4 extremities. Good symmetric motor tone is noted throughout.  Sensory: Sensory testing is intact to soft touch on all 4 extremities. No evidence of extinction is noted.  Coordination: Cerebellar testing reveals good finger-nose-finger and heel-to-shin bilaterally.  Gait and station: Gait is normal. Tandem gait is normal. Romberg is negative. No drift is seen.  Reflexes: Deep tendon reflexes are symmetric and normal bilaterally.    DIAGNOSTIC DATA (LABS, IMAGING, TESTING) - I reviewed patient records, labs, notes, testing and imaging myself where available.  Lab Results  Component Value Date   WBC 7.5 07/17/2023   HGB 13.7 07/17/2023   HCT 41.2 07/17/2023   MCV 91.8 07/17/2023   PLT 316.0 07/17/2023       Component Value Date/Time   NA 139 07/17/2023 1622   K 4.4 07/17/2023 1622   CL 103 07/17/2023 1622   CO2 29 07/17/2023 1622   GLUCOSE 95 07/17/2023 1622   BUN 21 07/17/2023 1622   CREATININE 0.89 07/17/2023 1622   CALCIUM 9.5 07/17/2023 1622   PROT 6.8 07/17/2023 1622   ALBUMIN 4.3 07/17/2023 1622   AST 19 07/17/2023 1622   ALT 14 07/17/2023 1622   ALKPHOS 88 07/17/2023 1622   BILITOT 0.4 07/17/2023 1622   GFRNONAA >60 12/02/2018 1629   GFRAA >60 12/02/2018 1629   Lab Results  Component Value Date   CHOL 240 (H) 10/27/2020   HDL 63.50 10/27/2020   LDLCALC 159 (H) 10/27/2020   LDLDIRECT 131.0 08/15/2012   TRIG 87.0 10/27/2020   CHOLHDL 4 10/27/2020  Lab Results  Component Value Date   HGBA1C 5.5 10/27/2020   Lab Results  Component Value Date   VITAMINB12 305 01/29/2020   Lab Results  Component Value Date   TSH 3.06 07/17/2023        No data to display               No data to display           ASSESSMENT AND PLAN  53 y.o. year old male  has a past medical history of Anxiety (12/04/2018) and GERD without esophagitis (07/25/2019). here with    No diagnosis found.  Lonni Dow ***.  Healthy lifestyle habits encouraged. *** will follow up with PCP as directed. *** will return to see me in ***, sooner if needed. *** verbalizes understanding and agreement with this plan.   No orders of the defined types were placed in this encounter.    No orders of the defined types were placed in this encounter.    Greig Forbes, MSN, FNP-C 02/04/2024, 10:44 AM  [facility] [address] [place], [place] [address] [phone]

## 2024-02-04 NOTE — Progress Notes (Unsigned)
 SABRA

## 2024-02-05 ENCOUNTER — Ambulatory Visit: Admitting: Family Medicine

## 2024-02-05 ENCOUNTER — Encounter: Payer: Self-pay | Admitting: Family Medicine

## 2024-02-05 VITALS — BP 160/86 | HR 65 | Ht 74.0 in | Wt 191.0 lb

## 2024-02-05 DIAGNOSIS — G4726 Circadian rhythm sleep disorder, shift work type: Secondary | ICD-10-CM | POA: Diagnosis not present

## 2024-02-05 DIAGNOSIS — G4733 Obstructive sleep apnea (adult) (pediatric): Secondary | ICD-10-CM
